# Patient Record
Sex: Male | Born: 1965 | Race: White | Hispanic: No | Marital: Single | Smoking: Never smoker
Health system: Southern US, Community
[De-identification: ages and names within clinical notes are randomized; demographics above are authoritative.]

## PROBLEM LIST (undated history)

## (undated) DIAGNOSIS — I82409 Acute embolism and thrombosis of unspecified deep veins of unspecified lower extremity: Secondary | ICD-10-CM

## (undated) DIAGNOSIS — I10 Essential (primary) hypertension: Secondary | ICD-10-CM

## (undated) HISTORY — DX: Acute embolism and thrombosis of unspecified deep veins of unspecified lower extremity: I82.409

---

## 2006-07-13 ENCOUNTER — Other Ambulatory Visit: Payer: Self-pay

## 2006-07-13 ENCOUNTER — Inpatient Hospital Stay: Payer: Self-pay | Admitting: Internal Medicine

## 2015-05-21 ENCOUNTER — Encounter: Payer: Self-pay | Admitting: Emergency Medicine

## 2015-05-21 ENCOUNTER — Inpatient Hospital Stay
Admission: EM | Admit: 2015-05-21 | Discharge: 2015-05-23 | DRG: 176 | Disposition: A | Discharge status: Other Acute Inpatient Hospital | Payer: BLUE CROSS/BLUE SHIELD | Attending: Internal Medicine | Admitting: Internal Medicine

## 2015-05-21 ENCOUNTER — Emergency Department: Payer: BLUE CROSS/BLUE SHIELD

## 2015-05-21 DIAGNOSIS — I10 Essential (primary) hypertension: Secondary | ICD-10-CM | POA: Diagnosis present

## 2015-05-21 DIAGNOSIS — K219 Gastro-esophageal reflux disease without esophagitis: Secondary | ICD-10-CM | POA: Diagnosis present

## 2015-05-21 DIAGNOSIS — I82431 Acute embolism and thrombosis of right popliteal vein: Secondary | ICD-10-CM | POA: Diagnosis present

## 2015-05-21 DIAGNOSIS — I5032 Chronic diastolic (congestive) heart failure: Secondary | ICD-10-CM | POA: Diagnosis present

## 2015-05-21 DIAGNOSIS — Z8249 Family history of ischemic heart disease and other diseases of the circulatory system: Secondary | ICD-10-CM | POA: Diagnosis not present

## 2015-05-21 DIAGNOSIS — R0600 Dyspnea, unspecified: Secondary | ICD-10-CM | POA: Diagnosis not present

## 2015-05-21 DIAGNOSIS — I2602 Saddle embolus of pulmonary artery with acute cor pulmonale: Secondary | ICD-10-CM | POA: Diagnosis not present

## 2015-05-21 DIAGNOSIS — Z7982 Long term (current) use of aspirin: Secondary | ICD-10-CM

## 2015-05-21 DIAGNOSIS — R0902 Hypoxemia: Secondary | ICD-10-CM | POA: Insufficient documentation

## 2015-05-21 DIAGNOSIS — I2609 Other pulmonary embolism with acute cor pulmonale: Secondary | ICD-10-CM | POA: Diagnosis not present

## 2015-05-21 DIAGNOSIS — Z79899 Other long term (current) drug therapy: Secondary | ICD-10-CM | POA: Diagnosis not present

## 2015-05-21 DIAGNOSIS — I959 Hypotension, unspecified: Secondary | ICD-10-CM | POA: Insufficient documentation

## 2015-05-21 DIAGNOSIS — I2699 Other pulmonary embolism without acute cor pulmonale: Secondary | ICD-10-CM | POA: Diagnosis present

## 2015-05-21 DIAGNOSIS — R0603 Acute respiratory distress: Secondary | ICD-10-CM | POA: Insufficient documentation

## 2015-05-21 DIAGNOSIS — I272 Other secondary pulmonary hypertension: Secondary | ICD-10-CM | POA: Diagnosis present

## 2015-05-21 DIAGNOSIS — I82411 Acute embolism and thrombosis of right femoral vein: Secondary | ICD-10-CM | POA: Diagnosis present

## 2015-05-21 DIAGNOSIS — I517 Cardiomegaly: Secondary | ICD-10-CM | POA: Insufficient documentation

## 2015-05-21 DIAGNOSIS — I824Z9 Acute embolism and thrombosis of unspecified deep veins of unspecified distal lower extremity: Secondary | ICD-10-CM | POA: Diagnosis not present

## 2015-05-21 DIAGNOSIS — I499 Cardiac arrhythmia, unspecified: Secondary | ICD-10-CM | POA: Diagnosis present

## 2015-05-21 DIAGNOSIS — R609 Edema, unspecified: Secondary | ICD-10-CM | POA: Insufficient documentation

## 2015-05-21 DIAGNOSIS — I2692 Saddle embolus of pulmonary artery without acute cor pulmonale: Secondary | ICD-10-CM | POA: Diagnosis not present

## 2015-05-21 DIAGNOSIS — D696 Thrombocytopenia, unspecified: Secondary | ICD-10-CM | POA: Diagnosis not present

## 2015-05-21 HISTORY — DX: Essential (primary) hypertension: I10

## 2015-05-21 LAB — BASIC METABOLIC PANEL
Anion gap: 10 (ref 5–15)
BUN: 10 mg/dL (ref 6–20)
CO2: 24 mmol/L (ref 22–32)
Calcium: 9.1 mg/dL (ref 8.9–10.3)
Chloride: 104 mmol/L (ref 101–111)
Creatinine, Ser: 1.08 mg/dL (ref 0.61–1.24)
GFR calc Af Amer: >60 mL/min (ref >60)
GFR calc non Af Amer: >60 mL/min (ref >60)
Glucose, Bld: 144 mg/dL — ABNORMAL HIGH (ref 65–99)
Potassium: 3.7 mmol/L (ref 3.5–5.1)
Sodium: 138 mmol/L (ref 135–145)

## 2015-05-21 LAB — CBC WITH DIFFERENTIAL/PLATELET
BASOS ABS: 0 10*3/uL (ref 0–0.1)
Basophils Relative: 0 %
Eosinophils Absolute: 0.2 10*3/uL (ref 0–0.7)
Eosinophils Relative: 1 %
HEMATOCRIT: 55.5 % — AB (ref 40.0–52.0)
Hemoglobin: 18.5 g/dL — ABNORMAL HIGH (ref 13.0–18.0)
LYMPHS ABS: 2.7 10*3/uL (ref 1.0–3.6)
LYMPHS PCT: 20 %
MCH: 31.5 pg (ref 26.0–34.0)
MCHC: 33.4 g/dL (ref 32.0–36.0)
MCV: 94.3 fL (ref 80.0–100.0)
MONO ABS: 1.3 10*3/uL — AB (ref 0.2–1.0)
MONOS PCT: 10 %
NEUTROS ABS: 9.2 10*3/uL — AB (ref 1.4–6.5)
Neutrophils Relative %: 69 %
Platelets: 139 10*3/uL — ABNORMAL LOW (ref 150–440)
RBC: 5.88 MIL/uL (ref 4.40–5.90)
RDW: 14.4 % (ref 11.5–14.5)
WBC: 13.4 10*3/uL — ABNORMAL HIGH (ref 3.8–10.6)

## 2015-05-21 LAB — APTT: aPTT: 26 seconds (ref 24–36)

## 2015-05-21 LAB — FIBRIN DERIVATIVES D-DIMER (ARMC ONLY): FIBRIN DERIVATIVES D-DIMER (ARMC): 5116 — AB (ref 0–499)

## 2015-05-21 LAB — PROTIME-INR
INR: 1.21
Prothrombin Time: 15.5 seconds — ABNORMAL HIGH (ref 11.4–15.0)

## 2015-05-21 LAB — TROPONIN I
TROPONIN I: 0.06 ng/mL — AB (ref <0.031)
Troponin I: 0.05 ng/mL — ABNORMAL HIGH (ref <0.031)

## 2015-05-21 MED ORDER — ASPIRIN 81 MG PO CHEW
324.0000 mg | CHEWABLE_TABLET | Freq: Once | ORAL | Status: AC
  Administered 2015-05-21: 324 mg via ORAL
  Filled 2015-05-21: qty 4

## 2015-05-21 MED ORDER — SODIUM CHLORIDE 0.9 % IJ SOLN
3.0000 mL | Freq: Two times a day (BID) | INTRAMUSCULAR | Status: DC
  Administered 2015-05-22 – 2015-05-23 (×4): 3 mL via INTRAVENOUS

## 2015-05-21 MED ORDER — SODIUM CHLORIDE 0.9 % IV BOLUS (SEPSIS)
1000.0000 mL | Freq: Once | INTRAVENOUS | Status: AC
  Administered 2015-05-21: 1000 mL via INTRAVENOUS

## 2015-05-21 MED ORDER — VERAPAMIL HCL ER 240 MG PO TBCR
240.0000 mg | EXTENDED_RELEASE_TABLET | Freq: Every day | ORAL | Status: DC
  Administered 2015-05-22 – 2015-05-23 (×2): 240 mg via ORAL
  Filled 2015-05-21 (×2): qty 1

## 2015-05-21 MED ORDER — PANTOPRAZOLE SODIUM 40 MG PO TBEC
40.0000 mg | DELAYED_RELEASE_TABLET | Freq: Every day | ORAL | Status: DC
  Administered 2015-05-22 – 2015-05-23 (×2): 40 mg via ORAL
  Filled 2015-05-21 (×2): qty 1

## 2015-05-21 MED ORDER — RAMIPRIL 10 MG PO CAPS
10.0000 mg | ORAL_CAPSULE | Freq: Every day | ORAL | Status: DC
  Administered 2015-05-22 – 2015-05-23 (×2): 10 mg via ORAL
  Filled 2015-05-21 (×2): qty 1

## 2015-05-21 MED ORDER — FUROSEMIDE 40 MG PO TABS
40.0000 mg | ORAL_TABLET | Freq: Every day | ORAL | Status: DC
  Administered 2015-05-22 – 2015-05-23 (×2): 40 mg via ORAL
  Filled 2015-05-21 (×2): qty 1

## 2015-05-21 MED ORDER — ADULT MULTIVITAMIN W/MINERALS CH
1.0000 | ORAL_TABLET | Freq: Every day | ORAL | Status: DC
  Administered 2015-05-22 – 2015-05-23 (×2): 1 via ORAL
  Filled 2015-05-21 (×2): qty 1

## 2015-05-21 MED ORDER — IOHEXOL 350 MG/ML SOLN
100.0000 mL | Freq: Once | INTRAVENOUS | Status: AC | PRN
  Administered 2015-05-21: 100 mL via INTRAVENOUS

## 2015-05-21 MED ORDER — ASPIRIN EC 81 MG PO TBEC
81.0000 mg | DELAYED_RELEASE_TABLET | Freq: Every day | ORAL | Status: DC
  Administered 2015-05-22 – 2015-05-23 (×2): 81 mg via ORAL
  Filled 2015-05-21 (×2): qty 1

## 2015-05-21 MED ORDER — HEPARIN SODIUM (PORCINE) 5000 UNIT/ML IJ SOLN: 4000.0000 [IU] | Freq: Once | INTRAMUSCULAR | Status: DC

## 2015-05-21 MED ORDER — IPRATROPIUM-ALBUTEROL 0.5-2.5 (3) MG/3ML IN SOLN: 3.0000 mL | RESPIRATORY_TRACT | Status: DC | PRN

## 2015-05-21 MED ORDER — HEPARIN (PORCINE) IN NACL 100-0.45 UNIT/ML-% IJ SOLN
1700.0000 [IU]/h | INTRAMUSCULAR | Status: DC
  Administered 2015-05-21: 1500 [IU]/h via INTRAVENOUS
  Administered 2015-05-22: 1700 [IU]/h via INTRAVENOUS
  Filled 2015-05-21 (×3): qty 250

## 2015-05-21 MED ORDER — HEPARIN BOLUS VIA INFUSION
5400.0000 [IU] | Freq: Once | INTRAVENOUS | Status: AC
  Administered 2015-05-21: 5400 [IU] via INTRAVENOUS
  Filled 2015-05-21: qty 5400

## 2015-05-21 MED ORDER — GABAPENTIN 300 MG PO CAPS
300.0000 mg | ORAL_CAPSULE | Freq: Every day | ORAL | Status: DC
  Filled 2015-05-21: qty 1

## 2015-05-21 MED ORDER — DICLOFENAC SODIUM 25 MG PO TBEC
75.0000 mg | DELAYED_RELEASE_TABLET | Freq: Two times a day (BID) | ORAL | Status: DC
  Administered 2015-05-22: 75 mg via ORAL
  Filled 2015-05-21 (×3): qty 1

## 2015-05-21 NOTE — Progress Notes (Addendum)
ANTICOAGULATION CONSULT NOTE - Initial Consult  Pharmacy Consult for Heparin   Indication: pulmonary embolus  Allergies  Allergen Reactions  . Penicillins Hives    Patient Measurements: Height: 5\' 8"  (172.7 cm) Weight: 225 lb (102.059 kg) IBW/kg (Calculated) : 68.4 Heparin Dosing Weight: 90.5 kg   Vital Signs: Temp: 98.4 F (36.9 C) (12/29 1704) Temp Source: Oral (12/29 1704) BP: 136/111 mmHg (12/29 1930) Pulse Rate: 122 (12/29 1930)  Labs:  Recent Labs  05/21/15 1659 05/21/15 2007  HGB 18.5*  --   HCT 55.5*  --   PLT 139*  --   CREATININE 1.08  --   TROPONINI 0.05* 0.06*    Estimated Creatinine Clearance: 93.7 mL/min (by C-G formula based on Cr of 1.08).   Medical History: Past Medical History  Diagnosis Date  . CHF (congestive heart failure) (HCC)   . Hypertension     Medications:   (Not in a hospital admission)  Assessment: Pharmacy consulted to dose heparin in this 49 year old male admitted with P.E.  No prior anticoag noted.   Goal of Therapy:  Heparin level 0.3-0.7 units/ml Monitor platelets by anticoagulation protocol: Yes   Plan:  Give 5400 units bolus x 1 Start heparin infusion at 1500 units/hr  Will order baseline INR and aPTT. Will draw 1st HL 6 hrs after start of drip on 12/30 @ 0400  Robbins,Jason D 05/21/2015,9:45 PM

## 2015-05-21 NOTE — ED Provider Notes (Signed)
[facility] Emergency Department Provider Note    ____________________________________________  Time seen: 1700  I have reviewed the triage vital signs and the nursing notes.   HISTORY  Chief Complaint Shortness of breath  History limited by: Not Limited   HPI Jermaine Blake is a 49 y.o. male who presents to the emergency department today from primary care doctor's office. The patient states he went to visit his primary care doctor because of shortness breath. He states it is been going on for 3 days. It has been constant. He has had a cough that is intermittently productive. He denies any chest pain. Denies any fevers. At the doctor's office and EKG was obtained which showed ventricular bigeminy. He was sent over for this ekg.   Past Medical History  Diagnosis Date  . CHF (congestive heart failure) (HCC)   . Hypertension     Patient Active Problem List   Diagnosis Date Noted  . Pulmonary embolism (HCC) 05/21/2015    History reviewed. No pertinent past surgical history.  Current Outpatient Rx  Name  Route  Sig  Dispense  Refill  . aspirin EC 81 MG tablet   Oral   Take 1 tablet by mouth daily.         . diclofenac (VOLTAREN) 75 MG EC tablet   Oral   Take 1 tablet by mouth 2 (two) times daily.         . furosemide (LASIX) 40 MG tablet   Oral   Take 1 tablet by mouth daily.         Marland Kitchen. gabapentin (NEURONTIN) 300 MG capsule   Oral   Take 1 capsule by mouth daily.         Marland Kitchen. ibuprofen (ADVIL,MOTRIN) 200 MG tablet   Oral   Take 1 tablet by mouth every 6 (six) hours as needed.         . Multiple Vitamin (MULTIVITAMIN WITH MINERALS) TABS tablet   Oral   Take 1 tablet by mouth daily.         . pantoprazole (PROTONIX) 40 MG tablet   Oral   Take 1 tablet by mouth daily.         . ramipril (ALTACE) 10 MG capsule   Oral   Take 1 capsule by mouth daily.         . verapamil (VERELAN PM) 240 MG 24 hr capsule   Oral   Take 1  capsule by mouth daily.           Allergies Penicillins  Family History  Problem Relation Age of Onset  . Hypertension Other     Social History Social History  Substance Use Topics  . Smoking status: Never Smoker   . Smokeless tobacco: None  . Alcohol Use: Yes     Comment: socially    Review of Systems  Constitutional: Negative for fever. Cardiovascular: Negative for chest pain. Respiratory: Positive for shortness of breath. Gastrointestinal: Negative for abdominal pain, vomiting and diarrhea. Neurological: Negative for headaches, focal weakness or numbness.  10-point ROS otherwise negative.  ____________________________________________   PHYSICAL EXAM:  VITAL SIGNS:   98.4 F (36.9 C)   128   22   162/101 mmHg  93 %      Constitutional: Alert and oriented. Well appearing and in no distress. Eyes: Conjunctivae are normal. PERRL. Normal extraocular movements. ENT   Head: Normocephalic and atraumatic.   Nose: No congestion/rhinnorhea.   Mouth/Throat: Mucous membranes are moist.  Neck: No stridor. Hematological/Lymphatic/Immunilogical: No cervical lymphadenopathy. Cardiovascular: Tachycardic, regular rhythm.  No murmurs, rubs, or gallops. Respiratory: Mildly increased respiratory effort. Tachypnea. Gastrointestinal: Soft and nontender. No distention.  Genitourinary: Deferred Musculoskeletal: Normal range of motion in all extremities. No joint effusions.  No lower extremity tenderness nor edema. Neurologic:  Normal speech and language. No gross focal neurologic deficits are appreciated.  Skin:  Skin is warm, dry and intact. No rash noted. Psychiatric: Mood and affect are normal. Speech and behavior are normal. Patient exhibits appropriate insight and judgment.  ____________________________________________    LABS (pertinent positives/negatives)  Labs Reviewed  CBC WITH DIFFERENTIAL/PLATELET - Abnormal; Notable for the following:    WBC  13.4 (*)    Hemoglobin 18.5 (*)    HCT 55.5 (*)    Platelets 139 (*)    Neutro Abs 9.2 (*)    Monocytes Absolute 1.3 (*)    All other components within normal limits  BASIC METABOLIC PANEL - Abnormal; Notable for the following:    Glucose, Bld 144 (*)    All other components within normal limits  TROPONIN I - Abnormal; Notable for the following:    Troponin I 0.05 (*)    All other components within normal limits  FIBRIN DERIVATIVES D-DIMER ([facility] ONLY) - Abnormal; Notable for the following:    Fibrin derivatives D-dimer [facility]) 5116 (*)    All other components within normal limits  TROPONIN I - Abnormal; Notable for the following:    Troponin I 0.06 (*)    All other components within normal limits  PROTIME-INR - Abnormal; Notable for the following:    Prothrombin Time 15.5 (*)    All other components within normal limits  APTT  BASIC METABOLIC PANEL  CBC  HEPARIN LEVEL (UNFRACTIONATED)     ____________________________________________   EKG  I, Phineas Semen, attending physician, personally viewed and interpreted this EKG  EKG Time: 1657 Rate: 137 Rhythm: sinus tachycardia Axis: normal Intervals: qtc 592 QRS: wide, RBBB ST changes: no st elevation Impression: abnormal ekg  EKG from PCPs office timed 1631 consistent with ventricular bigeminy   ____________________________________________    RADIOLOGY  CXR IMPRESSION: No active cardiopulmonary disease.  CTAPE  IMPRESSION: 1. Positive for acute PE with CTevidence of right heart strain (RV/LV Ratio = 1.9) consistent with at least submassive (intermediate risk) PE. The presence of right heart strain has been associated with an increased risk of morbidity and mortality. 2. Question mild gallbladder wall thickening with possible pericholecystic edema. Abdominal ultrasound could be used to further evaluate as clinically warranted. 3. Mild hepatoduodenal ligament lymphadenopathy.  I, Phineas Semen,  personally discussed these images and results by phone with the on-call radiologist and used this discussion as part of my medical decision making.    ___________________________________________   PROCEDURES  Procedure(s) performed: None  Critical Care performed: Yes, see critical care note(s)  CRITICAL CARE Performed by: Phineas Semen   Total critical care time: 40 minutes  Critical care time was exclusive of separately billable procedures and treating other patients.  Critical care was necessary to treat or prevent imminent or life-threatening deterioration.  Critical care was time spent personally by me on the following activities: development of treatment plan with patient and/or surrogate as well as nursing, discussions with consultants, evaluation of patient's response to treatment, examination of patient, obtaining history from patient or surrogate, ordering and performing treatments and interventions, ordering and review of laboratory studies, ordering and review of radiographic studies, pulse oximetry and re-evaluation of patient's  condition.  ____________________________________________   INITIAL IMPRESSION / ASSESSMENT AND PLAN / ED COURSE  Pertinent labs & imaging results that were available during my care of the patient were reviewed by me and considered in my medical decision making (see chart for details).  Patient presented to the emergency department today because of concerns for shortness breath and cough and abnormal EKG a primary care doctor's office. Patient was tachycardic upon initial arrival. Patient also had an elevated troponin. D-dimer was significantly elevated so a CTA PE was obtained. This did show bilateral significant clot burden. I had a discussion with critical care [facility] about potential thrombolytic therapy. Given that the patient is not hypoxic nor hypotensive they did not feel he was a candidate for thrombolytic therapy at this time.  Patient will be admitted to the hospital service and placed on heparin.  ____________________________________________   FINAL CLINICAL IMPRESSION(S) / ED DIAGNOSES  Final diagnoses:  Other acute pulmonary embolism (HCC)     Phineas Semen, MD 05/21/15 2313

## 2015-05-21 NOTE — Progress Notes (Signed)
eLink Physician-Brief Progress Note Patient Name: Venetia NightJerry L Gonterman DOB: 02-15-1966 MRN: [id number]   Date of Service  05/21/2015  HPI/Events of Note  49 yo male with unknown PMH. Presents to PCP with 3 day Hx of SOB. Sent to ED >> Chest CTA >> submassive bilateral PE with evidence of R heart strain. Meets criteria for PCCM consultation d/t persistent tachycardia (115-130) and positive troponin (0.05 >> 0.06). However, he is hemodynamically stable (SBP = 130-140) and his O2 sat on 2.0 L/min [place] O2 is in the mid to high 90's.   eICU Interventions  Plan: 1. Admit to stepdown unit or ICU (stepdown overflow) on the Hospitalist Service. 2. Heparin IV infusion. 3. 2D Cardiac Echo. 4. PCCM consultation. Dr. Belia HemanKasa is already aware. 5. Re-evaluate for [facility] for [facility] if he develops hemodynamic instability, refractory hypoxia or severe R heart dysfunction on 2D Cardiac Echo.   I have discussed the case with Dr. Belia HemanKasa (PCCM on call physician) who agrees with the above therapeutic plan.     Intervention Category Evaluation Type: New Patient Evaluation  Lenell AntuSommer,Dalin Caldera [place] 05/21/2015, 9:44 PM

## 2015-05-21 NOTE — H&P (Signed)
[facility] - [place] [facility]   PATIENT NAME: Jermaine Blake    MR#:  [id number]  DATE OF BIRTH:  [birth year]   DATE OF ADMISSION:  05/21/2015  PRIMARY CARE PHYSICIAN: Danella Penton., MD   REQUESTING/REFERRING PHYSICIAN: Derrill Kay  CHIEF COMPLAINT:   Chief Complaint  Patient presents with  . Cough  . Shortness of Breath  . Irregular Heart Beat    HISTORY OF PRESENT ILLNESS:  Jermaine Blake  is a 49 y.o. male with a known history of essential hypertension as well as congestive heart failure unspecified ejection fraction who is presenting with shortness of breath and cough. He states a few day duration of shortness of breath originally with dyspnea on exertion now at rest as well. Associated cough nonproductive however did have an episode of hemoptysis. He also describes having an episode of pleuritic chest pain left lower chest and mid axillary line which has resolved. He is presenting to the hospital after seeing his PCP who then recommended him to come to the Hospital further workup and evaluation.  PAST MEDICAL HISTORY:   Past Medical History  Diagnosis Date  . CHF (congestive heart failure) (HCC)   . Hypertension     PAST SURGICAL HISTORY:  History reviewed. No pertinent past surgical history.  SOCIAL HISTORY:   Social History  Substance Use Topics  . Smoking status: Never Smoker   . Smokeless tobacco: Not on file  . Alcohol Use: Yes     Comment: socially    FAMILY HISTORY:   Family History  Problem Relation Age of Onset  . Hypertension Other     DRUG ALLERGIES:   Allergies  Allergen Reactions  . Penicillins Hives    REVIEW OF SYSTEMS:  REVIEW OF SYSTEMS:  CONSTITUTIONAL: Denies fevers, chills, fatigue, weakness.  EYES: Denies blurred vision, double vision, or eye pain.  EARS, NOSE, THROAT: Denies tinnitus, ear pain, hearing loss.  RESPIRATORY: Positive cough, shortness of breath, tinnitus wheezing  CARDIOVASCULAR:  Positive chest pain, denies palpitations, edema.  GASTROINTESTINAL: Denies nausea, vomiting, diarrhea, abdominal pain.  GENITOURINARY: Denies dysuria, hematuria.  ENDOCRINE: Denies nocturia or thyroid problems. HEMATOLOGIC AND LYMPHATIC: Denies easy bruising or bleeding.  SKIN: Denies rash or lesions.  MUSCULOSKELETAL: Denies pain in neck, back, shoulder, knees, hips, or further arthritic symptoms.  NEUROLOGIC: Denies paralysis, paresthesias.  PSYCHIATRIC: Denies anxiety or depressive symptoms. Otherwise full review of systems performed by me is negative.   MEDICATIONS AT HOME:   Prior to Admission medications   Medication Sig Start Date End Date Taking? Authorizing Provider  aspirin EC 81 MG tablet Take 1 tablet by mouth daily.   Yes Historical Provider, MD  diclofenac (VOLTAREN) 75 MG EC tablet Take 1 tablet by mouth 2 (two) times daily.   Yes Historical Provider, MD  furosemide (LASIX) 40 MG tablet Take 1 tablet by mouth daily. 07/31/14  Yes Historical Provider, MD  gabapentin (NEURONTIN) 300 MG capsule Take 1 capsule by mouth daily.   Yes Historical Provider, MD  ibuprofen (ADVIL,MOTRIN) 200 MG tablet Take 1 tablet by mouth every 6 (six) hours as needed.   Yes Historical Provider, MD  Multiple Vitamin (MULTIVITAMIN WITH MINERALS) TABS tablet Take 1 tablet by mouth daily.   Yes Historical Provider, MD  pantoprazole (PROTONIX) 40 MG tablet Take 1 tablet by mouth daily. 07/31/14  Yes Historical Provider, MD  ramipril (ALTACE) 10 MG capsule Take 1 capsule by mouth daily. 07/31/14  Yes Historical Provider, MD  verapamil (VERELAN PM)  240 MG 24 hr capsule Take 1 capsule by mouth daily. 07/31/14  Yes Historical Provider, MD      VITAL SIGNS:  Blood pressure 136/111, pulse 122, temperature 98.4 F (36.9 C), temperature source Oral, resp. rate 21, height 5\' 8"  (1.727 m), weight 225 lb (102.059 kg), SpO2 94 %.  PHYSICAL EXAMINATION:  VITAL SIGNS: Filed Vitals:   05/21/15 1900 05/21/15 1930   BP: 143/105 136/111  Pulse: 119 122  Temp:    Resp: 24 21   GENERAL:49 y.o.male currently in no acute distress.  HEAD: Normocephalic, atraumatic.  EYES: Pupils equal, round, reactive to light. Extraocular muscles intact. No scleral icterus.  MOUTH: Moist mucosal membrane. Dentition intact. No abscess noted.  EAR, NOSE, THROAT: Clear without exudates. No external lesions.  NECK: Supple. No thyromegaly. No nodules. No JVD.  PULMONARY: Clear to ascultation, without wheeze rails or rhonci. No use of accessory muscles, Good respiratory effort. good air entry bilaterally CHEST: Nontender to palpation.  CARDIOVASCULAR: S1 and S2. Tachycardic. No murmurs, rubs, or gallops. No edema. Pedal pulses 2+ bilaterally.  GASTROINTESTINAL: Soft, nontender, nondistended. No masses. Positive bowel sounds. No hepatosplenomegaly.  MUSCULOSKELETAL: No swelling, clubbing, or edema. Range of motion full in all extremities.  NEUROLOGIC: Cranial nerves II through XII are intact. No gross focal neurological deficits. Sensation intact. Reflexes intact.  SKIN: No ulceration, lesions, rashes, or cyanosis. Skin warm and dry. Turgor intact.  PSYCHIATRIC: Mood, affect within normal limits. The patient is awake, alert and oriented x 3. Insight, judgment intact.    LABORATORY PANEL:   CBC  Recent Labs Lab 05/21/15 1659  WBC 13.4*  HGB 18.5*  HCT 55.5*  PLT 139*   ------------------------------------------------------------------------------------------------------------------  Chemistries   Recent Labs Lab 05/21/15 1659  NA 138  K 3.7  CL 104  CO2 24  GLUCOSE 144*  BUN 10  CREATININE 1.08  CALCIUM 9.1   ------------------------------------------------------------------------------------------------------------------  Cardiac Enzymes  Recent Labs Lab 05/21/15 2007  TROPONINI 0.06*    ------------------------------------------------------------------------------------------------------------------  RADIOLOGY:  Dg Chest 2 View  05/21/2015  CLINICAL DATA:  Shortness of breath EXAM: CHEST  2 VIEW COMPARISON:  07/13/2006 chest radiograph. FINDINGS: Stable cardiomediastinal silhouette with top-normal heart size. No pneumothorax. No pleural effusion. Clear lungs, with no focal lung consolidation and no pulmonary edema. IMPRESSION: No active cardiopulmonary disease. Electronically Signed   By: Delbert PhenixJason A Poff M.D.   On: 05/21/2015 17:42   Ct Angio Chest Pe W/cm &/or Wo Cm  05/21/2015  CLINICAL DATA:  Initial encounter for cough and difficulty breathing starting 3 days ago but worsening today. EXAM: CT ANGIOGRAPHY CHEST WITH CONTRAST TECHNIQUE: Multidetector CT imaging of the chest was performed using the standard protocol during bolus administration of intravenous contrast. Multiplanar CT image reconstructions and MIPs were obtained to evaluate the vascular anatomy. CONTRAST:  100mL OMNIPAQUE IOHEXOL 350 MG/ML SOLN COMPARISON:  None. FINDINGS: Mediastinum / Lymph Nodes: There is no axillary lymphadenopathy. No mediastinal lymphadenopathy. There is no hilar lymphadenopathy. Heart size is normal. No pericardial effusion. The esophagus has normal imaging features. No thoracic aortic aneurysm. No dissection of the thoracic aorta. Large volume pulmonary embolus is evident in both main pulmonary arteries, extending into lobar and segmental pulmonary arteries of both upper and lower lobes. Thrombus is occlusive in some segmental branches bilaterally. There is paradoxical inversion of the interventricular septum and the RV / LV ratio is 1.9. Lungs / Pleura: Lung windows show no focal airspace consolidation. There is some atelectasis in the lower lungs bilaterally, left greater  than right. No definite pulmonary infarct at this time. No pulmonary edema or pleural effusion. Upper Abdomen: Imaging through  the upper abdomen suggests mild gallbladder wall thickening with potentially a small amount of pericholecystic fluid. Mild lymphadenopathy is associated in the hepatoduodenal ligament. MSK / Soft Tissues: Bone windows reveal no worrisome lytic or sclerotic osseous lesions. Review of the MIP images confirms the above findings. IMPRESSION: 1. Positive for acute PE with CTevidence of right heart strain (RV/LV Ratio = 1.9) consistent with at least submassive (intermediate risk) PE. The presence of right heart strain has been associated with an increased risk of morbidity and mortality. 2. Question mild gallbladder wall thickening with possible pericholecystic edema. Abdominal ultrasound could be used to further evaluate as clinically warranted. 3. Mild hepatoduodenal ligament lymphadenopathy. Critical Value/emergent results were called by me at the time of interpretation on 05/21/2015 at 9:12 pm to Dr. Phineas Semen , who verbally acknowledged these results. Electronically Signed   By: Kennith Center M.D.   On: 05/21/2015 21:12    EKG:   Orders placed or performed during the hospital encounter of 05/21/15  . EKG 12-Lead  . EKG 12-Lead  . EKG 12-Lead  . EKG 12-Lead    IMPRESSION AND PLAN:   49 year old Caucasian gentleman history of essential hypertension who is presenting with shortness of breath, found to have pulmonary embolism  1 acute pulmonary embolus: There was evidence of right heart strain suggested by CT findings emergency department staff discussed case with critical care as well as pulmonology for further recommendations. At this time he does not qualify for any advanced interventional procedure or TPA given he is hemodynamically stable. He has been placed on heparin drip, continue anticoagulant coagulation place on telemetry trend cardiac enzymes, check echocardiogram.- 2. Essential hypertension: Continue home medications 3. GERD without esophagitis PPI therapy 4. Venous thromboembolism  prophylactic: Therapeutic heparin   All the records are reviewed and case discussed with ED provider. Management plans discussed with the patient, family and they are in agreement.  CODE STATUS: Full  TOTAL TIME TAKING CARE OF THIS PATIENT: 35 minutes.    Hower,  Mardi Mainland.D on 05/21/2015 at 10:53 PM  Between 7am to 6pm - Pager - [phone]  After 6pm: House Pager: - [phone]  [facility]  Office  [phone]  CC: Primary care physician; Danella Penton., MD

## 2015-05-21 NOTE — ED Notes (Signed)
Pt to ED from home c/o cough and difficulty breathing starting 3 days ago and worsening to today.  Pt states intermittent productive cough, otherwise denies chest pain, denies dizziness, or diaphoresis.  Pt states hx of CHF.  Pt reports regularly taking medications at home to control heart rate.  Pt is A&Ox4, speaking in complete and coherent sentences.

## 2015-05-22 ENCOUNTER — Inpatient Hospital Stay
Admit: 2015-05-22 | Discharge: 2015-05-22 | Disposition: A | Discharge status: Home / Self Care | Payer: BLUE CROSS/BLUE SHIELD | Attending: Internal Medicine | Admitting: Internal Medicine

## 2015-05-22 ENCOUNTER — Inpatient Hospital Stay: Payer: BLUE CROSS/BLUE SHIELD

## 2015-05-22 DIAGNOSIS — I2692 Saddle embolus of pulmonary artery without acute cor pulmonale: Secondary | ICD-10-CM

## 2015-05-22 LAB — BASIC METABOLIC PANEL
Anion gap: 9 (ref 5–15)
BUN: 10 mg/dL (ref 6–20)
CO2: 22 mmol/L (ref 22–32)
CREATININE: 1.12 mg/dL (ref 0.61–1.24)
Calcium: 8.7 mg/dL — ABNORMAL LOW (ref 8.9–10.3)
Chloride: 108 mmol/L (ref 101–111)
Glucose, Bld: 116 mg/dL — ABNORMAL HIGH (ref 65–99)
POTASSIUM: 4.1 mmol/L (ref 3.5–5.1)
SODIUM: 139 mmol/L (ref 135–145)

## 2015-05-22 LAB — GLUCOSE, CAPILLARY: GLUCOSE-CAPILLARY: 129 mg/dL — AB (ref 65–99)

## 2015-05-22 LAB — CBC
HCT: 46.2 % (ref 40.0–52.0)
HEMOGLOBIN: 15.5 g/dL (ref 13.0–18.0)
MCH: 32 pg (ref 26.0–34.0)
MCHC: 33.6 g/dL (ref 32.0–36.0)
MCV: 95.2 fL (ref 80.0–100.0)
Platelets: 99 10*3/uL — ABNORMAL LOW (ref 150–440)
RBC: 4.86 MIL/uL (ref 4.40–5.90)
RDW: 14.5 % (ref 11.5–14.5)
WBC: 10.2 10*3/uL (ref 3.8–10.6)

## 2015-05-22 LAB — HEPARIN LEVEL (UNFRACTIONATED): HEPARIN UNFRACTIONATED: 0.28 [IU]/mL — AB (ref 0.30–0.70)

## 2015-05-22 LAB — TROPONIN I
TROPONIN I: 0.14 ng/mL — AB (ref <0.031)
Troponin I: 0.05 ng/mL — ABNORMAL HIGH (ref <0.031)

## 2015-05-22 LAB — MAGNESIUM: Magnesium: 1.9 mg/dL (ref 1.7–2.4)

## 2015-05-22 LAB — MRSA PCR SCREENING: MRSA by PCR: NEGATIVE

## 2015-05-22 MED ORDER — HEPARIN BOLUS VIA INFUSION
1400.0000 [IU] | Freq: Once | INTRAVENOUS | Status: AC
  Administered 2015-05-22: 1400 [IU] via INTRAVENOUS
  Filled 2015-05-22: qty 1400

## 2015-05-22 MED ORDER — APIXABAN 5 MG PO TABS: 5.0000 mg | ORAL_TABLET | Freq: Two times a day (BID) | ORAL | Status: DC

## 2015-05-22 MED ORDER — APIXABAN 5 MG PO TABS
10.0000 mg | ORAL_TABLET | Freq: Two times a day (BID) | ORAL | Status: DC
  Administered 2015-05-22 – 2015-05-23 (×3): 10 mg via ORAL
  Filled 2015-05-22 (×3): qty 2

## 2015-05-22 MED ORDER — CETYLPYRIDINIUM CHLORIDE 0.05 % MT LIQD
7.0000 mL | Freq: Two times a day (BID) | OROMUCOSAL | Status: DC
  Administered 2015-05-22 – 2015-05-23 (×2): 7 mL via OROMUCOSAL

## 2015-05-22 NOTE — Care Management (Signed)
Patient admitted to icu with bilateral pulmonary embolus.  Discharge orders present and is to discharge home on Eliquis.  Provided patient with the 30 day free coupon and copay  assist coupon.  Patient states that he does have Nurse, learning disabilitycommercial insurance and does have coverage for medications.  During the conversation, note that patient appears dyspneic with conversation.  Per primary nurse, his  exertional  room air oxygen sat was 87.  His sustained heart rate has been low 100's but up to 130 with minimal exertion.  Patient will not discharge home today.   Will request script for 02 in the event there is a continued need for the 02.  Patient will need to be qualified for the home 02

## 2015-05-22 NOTE — Care Management (Signed)
It is documented that patient has a resting 02 sat of 79%. Patient's heart rate remains sustained at greater than 100. Primary nurse contacted CM in regards to  family concern that if patient discharge over the week end his drug store may not be not be ope and [facility]  Informed that would not be necessary- would just need to take the first script to a drug store that is open to use his coupon. Reported off to weekend care manager about concerns of low 02 sats and elevated heart rate in patient with new bilateral pulmonary embolus and sustained tachycardia

## 2015-05-22 NOTE — Progress Notes (Signed)
*  PRELIMINARY RESULTS* Echocardiogram 2D Echocardiogram has been performed.  Georgann HousekeeperJerry R Hege 05/22/2015, 2:17 PM

## 2015-05-22 NOTE — Consult Note (Signed)
PULMONARY CONSULT   PATIENT NAME: Jermaine Blake    MR#:  [id number]  DATE OF BIRTH:  05/25/1965   DATE OF ADMISSION:  05/21/2015  PRIMARY CARE PHYSICIAN: Danella Penton., MD   REQUESTING/REFERRING PHYSICIAN: HOWER  CHIEF COMPLAINT:   Chief Complaint  Patient presents with  . Cough  . Shortness of Breath  . Irregular Heart Beat    HISTORY OF PRESENT ILLNESS:  Jermaine Blake  is a 49 y.o. male with a known history of essential hypertension and remote h/o  CHF, patient is Surveyor, minerals, very active person, found to have b/l PE has been having symptoms of SOB and DOE for last 1 month. He Denies lower ext swelling.  Associated cough nonproductive however did have an episode of hemoptysis. He also describes having an episode of pleuritic chest pain left lower chest and mid axillary line which has resolved. He saw his  PCP who then recommended him to come to the Hospital further workup and evaluation.  Patient with NL BP, elevated HR and minimal oxygen requirement, oxygen helped his SOB  PAST MEDICAL HISTORY:   Past Medical History  Diagnosis Date  . CHF (congestive heart failure) (HCC)   . Hypertension     PAST SURGICAL HISTORY:  History reviewed. No pertinent past surgical history.  SOCIAL HISTORY:   Social History  Substance Use Topics  . Smoking status: Never Smoker   . Smokeless tobacco: Not on file  . Alcohol Use: Yes     Comment: socially    FAMILY HISTORY:   Family History  Problem Relation Age of Onset  . Hypertension Other     DRUG ALLERGIES:   Allergies  Allergen Reactions  . Penicillins Hives    REVIEW OF SYSTEMS:  REVIEW OF SYSTEMS:  CONSTITUTIONAL: Denies fevers, chills, fatigue, weakness.  EYES: Denies blurred vision, double vision, or eye pain.  EARS, NOSE, THROAT: Denies tinnitus, ear pain, hearing loss.  RESPIRATORY: Positive cough, shortness of breath, tinnitus wheezing  CARDIOVASCULAR: Positive chest pain, denies palpitations, edema.   GASTROINTESTINAL: Denies nausea, vomiting, diarrhea, abdominal pain.  GENITOURINARY: Denies dysuria, hematuria.  ENDOCRINE: Denies nocturia or thyroid problems. HEMATOLOGIC AND LYMPHATIC: Denies easy bruising or bleeding.  SKIN: Denies rash or lesions.  MUSCULOSKELETAL: Denies pain in neck, back, shoulder, knees, hips, or further arthritic symptoms.  NEUROLOGIC: Denies paralysis, paresthesias.  PSYCHIATRIC: Denies anxiety or depressive symptoms. Otherwise full review of systems is negative.   MEDICATIONS AT HOME:   Prior to Admission medications   Medication Sig Start Date End Date Taking? Authorizing Provider  aspirin EC 81 MG tablet Take 1 tablet by mouth daily.   Yes Historical Provider, MD  diclofenac (VOLTAREN) 75 MG EC tablet Take 1 tablet by mouth 2 (two) times daily.   Yes Historical Provider, MD  furosemide (LASIX) 40 MG tablet Take 1 tablet by mouth daily. 07/31/14  Yes Historical Provider, MD  gabapentin (NEURONTIN) 300 MG capsule Take 1 capsule by mouth daily.   Yes Historical Provider, MD  ibuprofen (ADVIL,MOTRIN) 200 MG tablet Take 1 tablet by mouth every 6 (six) hours as needed.   Yes Historical Provider, MD  Multiple Vitamin (MULTIVITAMIN WITH MINERALS) TABS tablet Take 1 tablet by mouth daily.   Yes Historical Provider, MD  pantoprazole (PROTONIX) 40 MG tablet Take 1 tablet by mouth daily. 07/31/14  Yes Historical Provider, MD  ramipril (ALTACE) 10 MG capsule Take 1 capsule by mouth daily. 07/31/14  Yes Historical Provider, MD  verapamil (VERELAN PM) 240 MG 24 hr capsule  Take 1 capsule by mouth daily. 07/31/14  Yes Historical Provider, MD      VITAL SIGNS:  Blood pressure 115/84, pulse 48, temperature 97.6 F (36.4 C), temperature source Oral, resp. rate 17, height 5\' 8"  (1.727 m), weight 228 lb 2.8 oz (103.5 kg), SpO2 91 %.  PHYSICAL EXAMINATION:  VITAL SIGNS: Filed Vitals:   05/22/15 0700 05/22/15 0733  BP: 115/84   Pulse: 48   Temp:  97.6 F (36.4 C)  Resp: 17     GENERAL:49 y.o.male currently in no acute distress.  HEAD: Normocephalic, atraumatic.  EYES: Pupils equal, round, reactive to light. Extraocular muscles intact. No scleral icterus.  MOUTH: Moist mucosal membrane. Dentition intact. No abscess noted.  EAR, NOSE, THROAT: Clear without exudates. No external lesions.  NECK: Supple. No thyromegaly. No nodules. No JVD.  PULMONARY: Clear to ascultation, without wheeze rails or rhonci. No use of accessory muscles, Good respiratory effort. good air entry bilaterally CHEST: Nontender to palpation.  CARDIOVASCULAR: S1 and S2. Tachycardic. No murmurs, rubs, or gallops. No edema. Pedal pulses 2+ bilaterally.  GASTROINTESTINAL: Soft, nontender, nondistended. No masses. Positive bowel sounds. No hepatosplenomegaly.  MUSCULOSKELETAL: No swelling, clubbing, or edema. Range of motion full in all extremities.  NEUROLOGIC: Cranial nerves II through XII are intact. No gross focal neurological deficits. Sensation intact. Reflexes intact.  SKIN: No ulceration, lesions, rashes, or cyanosis. Skin warm and dry. Turgor intact.  PSYCHIATRIC: Mood, affect within normal limits. The patient is awake, alert and oriented x 3.  LABORATORY PANEL:   CBC  Recent Labs Lab 05/22/15 0437  WBC 10.2  HGB 15.5  HCT 46.2  PLT 99*   ------------------------------------------------------------------------------------------------------------------  Chemistries   Recent Labs Lab 05/22/15 0002 05/22/15 0437  NA  --  139  K  --  4.1  CL  --  108  CO2  --  22  GLUCOSE  --  116*  BUN  --  10  CREATININE  --  1.12  CALCIUM  --  8.7*  MG 1.9  --    ------------------------------------------------------------------------------------------------------------------  Cardiac Enzymes  Recent Labs Lab 05/22/15 0437  TROPONINI 0.14*   ------------------------------------------------------------------------------------------------------------------  RADIOLOGY:  Dg  Chest 2 View  05/21/2015  CLINICAL DATA:  Shortness of breath EXAM: CHEST  2 VIEW COMPARISON:  07/13/2006 chest radiograph. FINDINGS: Stable cardiomediastinal silhouette with top-normal heart size. No pneumothorax. No pleural effusion. Clear lungs, with no focal lung consolidation and no pulmonary edema. IMPRESSION: No active cardiopulmonary disease. Electronically Signed   By: Delbert PhenixJason A Poff M.D.   On: 05/21/2015 17:42   Ct Angio Chest Pe W/cm &/or Wo Cm  05/21/2015  CLINICAL DATA:  Initial encounter for cough and difficulty breathing starting 3 days ago but worsening today. EXAM: CT ANGIOGRAPHY CHEST WITH CONTRAST TECHNIQUE: Multidetector CT imaging of the chest was performed using the standard protocol during bolus administration of intravenous contrast. Multiplanar CT image reconstructions and MIPs were obtained to evaluate the vascular anatomy. CONTRAST:  100mL OMNIPAQUE IOHEXOL 350 MG/ML SOLN COMPARISON:  None. FINDINGS: Mediastinum / Lymph Nodes: There is no axillary lymphadenopathy. No mediastinal lymphadenopathy. There is no hilar lymphadenopathy. Heart size is normal. No pericardial effusion. The esophagus has normal imaging features. No thoracic aortic aneurysm. No dissection of the thoracic aorta. Large volume pulmonary embolus is evident in both main pulmonary arteries, extending into lobar and segmental pulmonary arteries of both upper and lower lobes. Thrombus is occlusive in some segmental branches bilaterally. There is paradoxical inversion of the interventricular septum and the  RV / LV ratio is 1.9. Lungs / Pleura: Lung windows show no focal airspace consolidation. There is some atelectasis in the lower lungs bilaterally, left greater than right. No definite pulmonary infarct at this time. No pulmonary edema or pleural effusion. Upper Abdomen: Imaging through the upper abdomen suggests mild gallbladder wall thickening with potentially a small amount of pericholecystic fluid. Mild  lymphadenopathy is associated in the hepatoduodenal ligament. MSK / Soft Tissues: Bone windows reveal no worrisome lytic or sclerotic osseous lesions. Review of the MIP images confirms the above findings. IMPRESSION: 1. Positive for acute PE with CTevidence of right heart strain (RV/LV Ratio = 1.9) consistent with at least submassive (intermediate risk) PE. The presence of right heart strain has been associated with an increased risk of morbidity and mortality. 2. Question mild gallbladder wall thickening with possible pericholecystic edema. Abdominal ultrasound could be used to further evaluate as clinically warranted. 3. Mild hepatoduodenal ligament lymphadenopathy. Critical Value/emergent results were called by me at the time of interpretation on 05/21/2015 at 9:12 pm to Dr. Phineas Semen , who verbally acknowledged these results. Electronically Signed   By: Kennith Center M.D.   On: 05/21/2015 21:12    EKG:   Orders placed or performed during the hospital encounter of 05/21/15  . EKG 12-Lead  . EKG 12-Lead  . EKG 12-Lead  . EKG 12-Lead    IMPRESSION AND PLAN:   49 year old WM with acute B/L PE At this time, patient does NOT meet criteria for TPA.   1.continue anticoagulation therapy-consider transition to oral therapy 2.oxygen as needed 3.recommend Hem/ONC referral and can be done as outpatient 4.check [place] lower ext assess for DVT   No there recommendations. OK to transfer to gen med floor. Will sign off at this time I have personally obtained a history, examined the patient, evaluated Pertinent laboratory and RadioGraphic/imaging results, and  formulated the assessment and plan  The Patient requires high complexity decision making for assessment and support, frequent evaluation and titration of therapies. Patient satisfied with Plan of action and management. All questions answered  Lucie Leather, M.D.  [facility]  Medical Director [facility]  [facility] Medical Director [facility] Cardio-Pulmonary Department

## 2015-05-22 NOTE — Progress Notes (Signed)
Ambulated patient and he became short of breath, heart rate was up to 130's, oxygen saturation dropped to 87%, and case management notified. Case manager Nann stated that she would be talking with Dr. Amado CoeGouru. Dr. Belia HemanKasa also notified of ultrasound results.

## 2015-05-22 NOTE — Progress Notes (Signed)
ANTICOAGULATION CONSULT NOTE - Initial Consult  Pharmacy Consult for Eliquis (Apixaban)  Indication: pulmonary embolus  Allergies  Allergen Reactions  . Penicillins Hives   Patient Measurements: Height: 5\' 8"  (172.7 cm) Weight: 228 lb 2.8 oz (103.5 kg) IBW/kg (Calculated) : 68.4  Vital Signs: Temp: 98.2 F (36.8 C) (12/30 1200) Temp Source: Oral (12/30 1200) BP: 136/90 mmHg (12/30 1200) Pulse Rate: 102 (12/30 1200)  Recent Labs  05/21/15 1659 05/21/15 2007 05/22/15 0002 05/22/15 0437  HGB 18.5*  --   --  15.5  HCT 55.5*  --   --  46.2  PLT 139*  --   --  99*  APTT 26  --   --   --   LABPROT 15.5*  --   --   --   INR 1.21  --   --   --   HEPARINUNFRC  --   --   --  0.28*  CREATININE 1.08  --   --  1.12  TROPONINI 0.05* 0.06* 0.05* 0.14*   Estimated Creatinine Clearance: 90.9 mL/min (by C-G formula based on Cr of 1.12).  Medical History: Past Medical History  Diagnosis Date  . CHF (congestive heart failure) (HCC)   . Hypertension    Assessment: 49 yo active male with bilateral PE. Pharmacy consulted for dosing of Eliquis. Patient was started on heparin infusion on admission.   CrCl: 91 ml/min  Scr 1.12  Wt: 104 kg    Plan:  Discontinue heparin drip and start patient on Apixaban (Eliquis) 10mg  BID for 7 days, followed by apixaban 5mg  BID.   Pharmacy will continue to monitor per consult.   Cher NakaiSheema Galia Rahm, PharmD Pharmacy Resident  05/22/2015,12:48 PM

## 2015-05-22 NOTE — Progress Notes (Signed)
ANTICOAGULATION CONSULT NOTE - Initial Consult  Pharmacy Consult for Heparin   Indication: pulmonary embolus  Allergies  Allergen Reactions  . Penicillins Hives    Patient Measurements: Height: 5\' 8"  (172.7 cm) Weight: 228 lb 2.8 oz (103.5 kg) IBW/kg (Calculated) : 68.4 Heparin Dosing Weight: 90.5 kg   Vital Signs: Temp: 98.2 F (36.8 C) (12/29 2343) Temp Source: Oral (12/29 2343) BP: 116/97 mmHg (12/30 0200) Pulse Rate: 113 (12/30 0200)  Labs:  Recent Labs  05/21/15 1659 05/21/15 2007 05/22/15 0002 05/22/15 0437  HGB 18.5*  --   --  15.5  HCT 55.5*  --   --  46.2  PLT 139*  --   --  99*  APTT 26  --   --   --   LABPROT 15.5*  --   --   --   INR 1.21  --   --   --   HEPARINUNFRC  --   --   --  0.28*  CREATININE 1.08  --   --  1.12  TROPONINI 0.05* 0.06* 0.05*  --     Estimated Creatinine Clearance: 90.9 mL/min (by C-G formula based on Cr of 1.12).   Medical History: Past Medical History  Diagnosis Date  . CHF (congestive heart failure) (HCC)   . Hypertension     Medications:  Prescriptions prior to admission  Medication Sig Dispense Refill Last Dose  . aspirin EC 81 MG tablet Take 1 tablet by mouth daily.   05/21/2015 at Unknown time  . diclofenac (VOLTAREN) 75 MG EC tablet Take 1 tablet by mouth 2 (two) times daily.   05/21/2015 at Unknown time  . furosemide (LASIX) 40 MG tablet Take 1 tablet by mouth daily.   05/21/2015 at Unknown time  . gabapentin (NEURONTIN) 300 MG capsule Take 1 capsule by mouth daily.   05/21/2015 at Unknown time  . ibuprofen (ADVIL,MOTRIN) 200 MG tablet Take 1 tablet by mouth every 6 (six) hours as needed.   PRN at Unknown time  . Multiple Vitamin (MULTIVITAMIN WITH MINERALS) TABS tablet Take 1 tablet by mouth daily.   05/21/2015 at Unknown time  . pantoprazole (PROTONIX) 40 MG tablet Take 1 tablet by mouth daily.   05/21/2015 at Unknown time  . ramipril (ALTACE) 10 MG capsule Take 1 capsule by mouth daily.   05/21/2015 at Unknown  time  . verapamil (VERELAN PM) 240 MG 24 hr capsule Take 1 capsule by mouth daily.   05/21/2015 at Unknown time    Assessment: Pharmacy consulted to dose heparin in this 49 year old male admitted with P.E.  No prior anticoag noted.   Goal of Therapy:  Heparin level 0.3-0.7 units/ml Monitor platelets by anticoagulation protocol: Yes   Plan:  Give 5400 units bolus x 1 Start heparin infusion at 1500 units/hr  Will order baseline INR and aPTT. Will draw 1st HL 6 hrs after start of drip on 12/30 @ 0400.  12/30 AM heparin level 0.28. 1400 unit bolus and increase rate to 1700 units/hr. Recheck in 6 hours.  Lane Eland S 05/22/2015,5:26 AM

## 2015-05-22 NOTE — Progress Notes (Signed)
Notified jeanne,rn at [facility] concerning pt's elevated troponin level of 0.14.  No new orders were given.  Heparin drip was adjusted according to orders.  Pt is alert and oriented with no complaints of pain.

## 2015-05-22 NOTE — Progress Notes (Signed)
Patients oxygen saturation at 79% while at rest. Placed him back on 2 liters nasal cannula and will continue to assess and monitor.

## 2015-05-22 NOTE — Progress Notes (Signed)
[facility] [place] at [facility]   PATIENT NAME: Jermaine Blake    MR#:  [id number]  DATE OF BIRTH:  1965-11-06  SUBJECTIVE:  CHIEF COMPLAINT:  Patient denies any chest pain or shortness of breath today but became very short of breath and tachycardic with heart rate around 1:30 and a pulse ox at 87% with ambulation. Patient was not sedentary and walking 3 miles per day prior to this admission  REVIEW OF SYSTEMS:  CONSTITUTIONAL: No fever, fatigue or weakness.  EYES: No blurred or double vision.  EARS, NOSE, AND THROAT: No tinnitus or ear pain.  RESPIRATORY: No cough, shortness of breath, wheezing or hemoptysis.  CARDIOVASCULAR: No chest pain, orthopnea, edema.  GASTROINTESTINAL: No nausea, vomiting, diarrhea or abdominal pain.  GENITOURINARY: No dysuria, hematuria.  ENDOCRINE: No polyuria, nocturia,  HEMATOLOGY: No anemia, easy bruising or bleeding SKIN: No rash or lesion. MUSCULOSKELETAL: No joint pain or arthritis.   NEUROLOGIC: No tingling, numbness, weakness.  PSYCHIATRY: No anxiety or depression.   DRUG ALLERGIES:   Allergies  Allergen Reactions  . Penicillins Hives    VITALS:  Blood pressure 113/72, pulse 111, temperature 98.2 F (36.8 C), temperature source Oral, resp. rate 17, height  (1.727 m), weight 103.5 kg (228 lb 2.8 oz), SpO2 100 %.  PHYSICAL EXAMINATION:  GENERAL:  49 y.o.-year-old patient lying in the bed with no acute distress.  EYES: Pupils equal, round, reactive to light and accommodation. No scleral icterus. Extraocular muscles intact.  HEENT: Head atraumatic, normocephalic. Oropharynx and nasopharynx clear.  NECK:  Supple, no jugular venous distention. No thyroid enlargement, no tenderness.  LUNGS: Normal breath sounds bilaterally, no wheezing, rales,rhonchi or crepitation. No use of accessory muscles of respiration.  CARDIOVASCULAR: S1, S2 normal. No murmurs, rubs, or gallops.  ABDOMEN: Soft, nontender, nondistended.  Bowel sounds present. No organomegaly or mass.  EXTREMITIES: No calf  muscle tenderness No pedal edema, cyanosis, or clubbing.  NEUROLOGIC: Cranial nerves II through XII are intact. Muscle strength 5/5 in all extremities. Sensation intact. Gait not checked.  PSYCHIATRIC: The patient is alert and oriented x 3.  SKIN: No obvious rash, lesion, or ulcer.    LABORATORY PANEL:   CBC  Recent Labs Lab 05/22/15 0437  WBC 10.2  HGB 15.5  HCT 46.2  PLT 99*   ------------------------------------------------------------------------------------------------------------------  Chemistries   Recent Labs Lab 05/22/15 0002 05/22/15 0437  NA  --  139  K  --  4.1  CL  --  108  CO2  --  22  GLUCOSE  --  116*  BUN  --  10  CREATININE  --  1.12  CALCIUM  --  8.7*  MG 1.9  --    ------------------------------------------------------------------------------------------------------------------  Cardiac Enzymes  Recent Labs Lab 05/22/15 1239  TROPONINI <0.03   ------------------------------------------------------------------------------------------------------------------  RADIOLOGY:  Dg Chest 2 View  05/21/2015  CLINICAL DATA:  Shortness of breath EXAM: CHEST  2 VIEW COMPARISON:  07/13/2006 chest radiograph. FINDINGS: Stable cardiomediastinal silhouette with top-normal heart size. No pneumothorax. No pleural effusion. Clear lungs, with no focal lung consolidation and no pulmonary edema. IMPRESSION: No active cardiopulmonary disease. Electronically Signed   By: Delbert Phenix M.D.   On: 05/21/2015 17:42   Ct Angio Chest Pe W/cm &/or Wo Cm  05/21/2015  CLINICAL DATA:  Initial encounter for cough and difficulty breathing starting 3 days ago but worsening today. EXAM: CT ANGIOGRAPHY CHEST WITH CONTRAST TECHNIQUE: Multidetector CT imaging of the chest was performed using the standard  protocol during bolus administration of intravenous contrast. Multiplanar CT image reconstructions and MIPs were  obtained to evaluate the vascular anatomy. CONTRAST:  OMNIPAQUE IOHEXOL 350 MG/ML SOLN COMPARISON:  None. FINDINGS: Mediastinum / Lymph Nodes: There is no axillary lymphadenopathy. No mediastinal lymphadenopathy. There is no hilar lymphadenopathy. Heart size is normal. No pericardial effusion. The esophagus has normal imaging features. No thoracic aortic aneurysm. No dissection of the thoracic aorta. Large volume pulmonary embolus is evident in both main pulmonary arteries, extending into lobar and segmental pulmonary arteries of both upper and lower lobes. Thrombus is occlusive in some segmental branches bilaterally. There is paradoxical inversion of the interventricular septum and the RV / LV ratio is 1.9. Lungs / Pleura: Lung windows show no focal airspace consolidation. There is some atelectasis in the lower lungs bilaterally, left greater than right. No definite pulmonary infarct at this time. No pulmonary edema or pleural effusion. Upper Abdomen: Imaging through the upper abdomen suggests mild gallbladder wall thickening with potentially a small amount of pericholecystic fluid. Mild lymphadenopathy is associated in the hepatoduodenal ligament. MSK / Soft Tissues: Bone windows reveal no worrisome lytic or sclerotic osseous lesions. Review of the MIP images confirms the above findings. IMPRESSION: 1. Positive for acute PE with CTevidence of right heart strain (RV/LV Ratio = 1.9) consistent with at least submassive (intermediate risk) PE. The presence of right heart strain has been associated with an increased risk of morbidity and mortality. 2. Question mild gallbladder wall thickening with possible pericholecystic edema. Abdominal ultrasound could be used to further evaluate as clinically warranted. 3. Mild hepatoduodenal ligament lymphadenopathy. Critical Value/emergent results were called by me at the time of interpretation on 05/21/2015 at 9:12 pm to Dr. Phineas Semen , who verbally acknowledged  these results. Electronically Signed   By: Kennith Center M.D.   On: 05/21/2015 21:12   US Venous Img Lower Bilateral  05/22/2015  ADDENDUM REPORT: 05/22/2015 12:37 ADDENDUM: These results were called by telephone at the time of interpretation on 05/22/2015 at 12:20 pm to Dr. Ramonita Lab , who verbally acknowledged these results. Electronically Signed   By: Bary Richard M.D.   On: 05/22/2015 12:37  05/22/2015  CLINICAL DATA:  Pulmonary emboli. EXAM: BILATERAL LOWER EXTREMITY VENOUS DOPPLER ULTRASOUND TECHNIQUE: Gray-scale sonography with graded compression, as well as color Doppler and duplex ultrasound were performed to evaluate the lower extremity deep venous systems from the level of the common femoral vein and including the common femoral, femoral, profunda femoral, popliteal and calf veins including the posterior tibial, peroneal and gastrocnemius veins when visible. The superficial great saphenous vein was also interrogated. Spectral Doppler was utilized to evaluate flow at rest and with distal augmentation maneuvers in the common femoral, femoral and popliteal veins. COMPARISON:  None. FINDINGS: RIGHT LOWER EXTREMITY Common Femoral Vein: No evidence of thrombus. Normal compressibility, respiratory phasicity and response to augmentation. Saphenofemoral Junction: No evidence of thrombus. Normal compressibility and flow on color Doppler imaging. Profunda Femoral Vein: No evidence of thrombus. Normal compressibility and flow on color Doppler imaging. Femoral Vein: Nonocclusive thrombus within the proximal right superficial femoral vein. Popliteal Vein: Nonocclusive thrombus throughout the right popliteal vein. Calf Veins: No evidence of thrombus. Normal compressibility and flow on color Doppler imaging. Superficial Great Saphenous Vein: No evidence of thrombus. Normal compressibility and flow on color Doppler imaging. Venous Reflux:  None. Other Findings:  None. LEFT LOWER EXTREMITY Common Femoral Vein: No  evidence of thrombus. Normal compressibility, respiratory phasicity and response to augmentation. Saphenofemoral  Junction: No evidence of thrombus. Normal compressibility and flow on color Doppler imaging. Profunda Femoral Vein: No evidence of thrombus. Normal compressibility and flow on color Doppler imaging. Femoral Vein: No evidence of thrombus. Normal compressibility, respiratory phasicity and response to augmentation. Popliteal Vein: No evidence of thrombus. Normal compressibility, respiratory phasicity and response to augmentation. Calf Veins: No evidence of thrombus. Normal compressibility and flow on color Doppler imaging. Superficial Great Saphenous Vein: No evidence of thrombus. Normal compressibility and flow on color Doppler imaging. Venous Reflux:  None. Other Findings:  None. IMPRESSION: Nonocclusive deep venous thrombosis within the RIGHT femoral vein and popliteal vein. No deep venous thrombosis identified within the left lower extremity. Electronically Signed: By: Bary RichardStan  Maynard M.D. On: 05/22/2015 12:09    EKG:   Orders placed or performed during the hospital encounter of 05/21/15  . EKG 12-Lead  . EKG 12-Lead  . EKG 12-Lead  . EKG 12-Lead    ASSESSMENT AND PLAN:   49 year old Caucasian gentleman history of essential hypertension who is presenting with shortness of breath, found to have pulmonary embolism  1 acute pulmonary embolus probably from right femoral and popliteal DVT  There was evidence of right heart strain suggested by CT .  Pt  does not qualify for any advanced interventional procedure or TPA given he is hemodynamically stable.  He has been placed on heparin drip,will change to NOAC -Eliquis Appreciate pulmonology recommendations Troponin is elevated from pulmonary embolism and underlying right lower extremity DVT Patient is tachycardic and became desaturated with ambulation today. Will continue oxygen and reassess him in a.m. Consults oncology Echo pending     2. Essential hypertension: Continue home medications  3. GERD without esophagitis PPI therapy  4. Venous thromboembolism prophylactic: Therapeutic heparin     All the records are reviewed and case discussed with Care Management/Social Workerr. Management plans discussed with the patient, family and they are in agreement.  CODE STATUS: fc  TOTAL critical TIME TAKING CARE OF THIS PATIENT: 35  minutes.   POSSIBLE D/C IN 1-2 DAYS, DEPENDING ON CLINICAL CONDITION.   Ramonita LabGouru, Obadiah Dennard M.D on 05/22/2015 at 2:10 PM  Between 7am to 6pm - Pager - [phone] After 6pm go to [email or web address] - password [email or web address] [facility]  [facility]  Office  [phone]  CC: Primary care physician; Danella PentonMILLER,MARK F., MD

## 2015-05-23 ENCOUNTER — Inpatient Hospital Stay (HOSPITAL_COMMUNITY)
Admission: AD | Admit: 2015-05-23 | Discharge: 2015-05-25 | DRG: 175 | Disposition: A | Discharge status: Home / Self Care | Payer: BLUE CROSS/BLUE SHIELD | Source: Other Acute Inpatient Hospital | Attending: Pulmonary Disease | Admitting: Pulmonary Disease

## 2015-05-23 ENCOUNTER — Inpatient Hospital Stay (HOSPITAL_COMMUNITY)
Admit: 2015-05-23 | Discharge: 2015-05-23 | Disposition: A | Discharge status: Home / Self Care | Payer: BLUE CROSS/BLUE SHIELD | Attending: Cardiovascular Disease | Admitting: Cardiovascular Disease

## 2015-05-23 ENCOUNTER — Encounter (HOSPITAL_COMMUNITY): Payer: Self-pay | Admitting: Pulmonary Disease

## 2015-05-23 DIAGNOSIS — I959 Hypotension, unspecified: Secondary | ICD-10-CM | POA: Diagnosis present

## 2015-05-23 DIAGNOSIS — R609 Edema, unspecified: Secondary | ICD-10-CM | POA: Diagnosis not present

## 2015-05-23 DIAGNOSIS — I2602 Saddle embolus of pulmonary artery with acute cor pulmonale: Secondary | ICD-10-CM | POA: Diagnosis not present

## 2015-05-23 DIAGNOSIS — I2692 Saddle embolus of pulmonary artery without acute cor pulmonale: Secondary | ICD-10-CM | POA: Diagnosis not present

## 2015-05-23 DIAGNOSIS — I517 Cardiomegaly: Secondary | ICD-10-CM

## 2015-05-23 DIAGNOSIS — Z7982 Long term (current) use of aspirin: Secondary | ICD-10-CM

## 2015-05-23 DIAGNOSIS — J9601 Acute respiratory failure with hypoxia: Secondary | ICD-10-CM | POA: Diagnosis present

## 2015-05-23 DIAGNOSIS — I82401 Acute embolism and thrombosis of unspecified deep veins of right lower extremity: Secondary | ICD-10-CM | POA: Diagnosis present

## 2015-05-23 DIAGNOSIS — R0902 Hypoxemia: Secondary | ICD-10-CM

## 2015-05-23 DIAGNOSIS — Z79899 Other long term (current) drug therapy: Secondary | ICD-10-CM

## 2015-05-23 DIAGNOSIS — I82411 Acute embolism and thrombosis of right femoral vein: Secondary | ICD-10-CM

## 2015-05-23 DIAGNOSIS — R0600 Dyspnea, unspecified: Secondary | ICD-10-CM | POA: Diagnosis not present

## 2015-05-23 DIAGNOSIS — I10 Essential (primary) hypertension: Secondary | ICD-10-CM | POA: Diagnosis present

## 2015-05-23 DIAGNOSIS — Z880 Allergy status to penicillin: Secondary | ICD-10-CM

## 2015-05-23 DIAGNOSIS — I2699 Other pulmonary embolism without acute cor pulmonale: Secondary | ICD-10-CM

## 2015-05-23 DIAGNOSIS — D696 Thrombocytopenia, unspecified: Secondary | ICD-10-CM | POA: Diagnosis present

## 2015-05-23 DIAGNOSIS — I519 Heart disease, unspecified: Secondary | ICD-10-CM | POA: Diagnosis present

## 2015-05-23 DIAGNOSIS — I2609 Other pulmonary embolism with acute cor pulmonale: Secondary | ICD-10-CM

## 2015-05-23 DIAGNOSIS — I824Z9 Acute embolism and thrombosis of unspecified deep veins of unspecified distal lower extremity: Secondary | ICD-10-CM | POA: Diagnosis not present

## 2015-05-23 DIAGNOSIS — R0603 Acute respiratory distress: Secondary | ICD-10-CM | POA: Insufficient documentation

## 2015-05-23 DIAGNOSIS — E861 Hypovolemia: Secondary | ICD-10-CM | POA: Diagnosis present

## 2015-05-23 LAB — CBC
HEMATOCRIT: 45.8 % (ref 40.0–52.0)
Hemoglobin: 15.2 g/dL (ref 13.0–18.0)
MCH: 30.9 pg (ref 26.0–34.0)
MCHC: 33.1 g/dL (ref 32.0–36.0)
MCV: 93.1 fL (ref 80.0–100.0)
PLATELETS: 115 10*3/uL — AB (ref 150–440)
RBC: 4.92 MIL/uL (ref 4.40–5.90)
RDW: 14.6 % — AB (ref 11.5–14.5)
WBC: 8.5 10*3/uL (ref 3.8–10.6)

## 2015-05-23 LAB — BASIC METABOLIC PANEL
ANION GAP: 9 (ref 5–15)
BUN: 12 mg/dL (ref 6–20)
CALCIUM: 9 mg/dL (ref 8.9–10.3)
CO2: 23 mmol/L (ref 22–32)
CREATININE: 1.1 mg/dL (ref 0.61–1.24)
Chloride: 108 mmol/L (ref 101–111)
GFR calc Af Amer: >60 mL/min (ref >60)
GLUCOSE: 109 mg/dL — AB (ref 65–99)
Potassium: 3.6 mmol/L (ref 3.5–5.1)
Sodium: 140 mmol/L (ref 135–145)

## 2015-05-23 LAB — TROPONIN I: Troponin I: <0.03 ng/mL (ref <0.031)

## 2015-05-23 MED ORDER — HEPARIN (PORCINE) IN NACL 100-0.45 UNIT/ML-% IJ SOLN
1600.0000 [IU]/h | INTRAMUSCULAR | Status: DC
  Administered 2015-05-23: 1600 [IU]/h via INTRAVENOUS
  Filled 2015-05-23 (×2): qty 250

## 2015-05-23 MED ORDER — SODIUM CHLORIDE 0.9 % IV BOLUS (SEPSIS)
500.0000 mL | INTRAVENOUS | Status: AC
  Administered 2015-05-23: 500 mL via INTRAVENOUS

## 2015-05-23 MED ORDER — SODIUM CHLORIDE 0.9 % IV BOLUS (SEPSIS): 500.0000 mL | Freq: Once | INTRAVENOUS | Status: AC

## 2015-05-23 MED ORDER — PANTOPRAZOLE SODIUM 40 MG PO TBEC
40.0000 mg | DELAYED_RELEASE_TABLET | Freq: Every day | ORAL | Status: DC
  Administered 2015-05-24 – 2015-05-25 (×2): 40 mg via ORAL
  Filled 2015-05-23 (×2): qty 1

## 2015-05-23 MED ORDER — ADULT MULTIVITAMIN W/MINERALS CH
1.0000 | ORAL_TABLET | Freq: Every day | ORAL | Status: DC
Start: 2015-05-23 — End: 2015-05-25
  Administered 2015-05-24 – 2015-05-25 (×2): 1 via ORAL
  Filled 2015-05-23 (×2): qty 1

## 2015-05-23 MED ORDER — ACETAMINOPHEN 325 MG PO TABS: 650.0000 mg | ORAL_TABLET | Freq: Four times a day (QID) | ORAL | Status: DC | PRN

## 2015-05-23 MED ORDER — LACTATED RINGERS IV SOLN
INTRAVENOUS | Status: DC
  Administered 2015-05-23: 19:00:00 via INTRAVENOUS

## 2015-05-23 MED ORDER — GABAPENTIN 300 MG PO CAPS: 300.0000 mg | ORAL_CAPSULE | Freq: Every day | ORAL | Status: DC

## 2015-05-23 NOTE — Consult Note (Signed)
Consult Note  Patient name: Jermaine Blake MRN: [id number] DOB: [birth year] Sex: male  Consulting Physician:  Hospitalist service  Reason for Consult:  Chief Complaint  Patient presents with  . Cough  . Shortness of Breath  . Irregular Heart Beat    HISTORY OF PRESENT ILLNESS: This is a 49 year old gentleman who was admitted on 1229 with complaints of shortness of breath.  This is been going on for several days.  He also complained of some mild chest pain.  Ultrasound revealed right femoral vein DVT.  CT scan showed acute PE with right heart strain.  He was admitted to the ICU and started on anticoagulation.  He was stable yesterday, however today after walking he became hypotensive with blood pressure in the 60s and 70s.  He is not complaining of shortness of breath but did drop his oxygen saturation saturations into the mid 70s without oxygen.  He is not having any more chest pain.Marland Kitchen.  He denies a previous history of DVT.  There is no family history of DVT.  He denies any prolonged traveling, recent changes in his medication The patient has a history of congestive heart failure and hypertension  Past Medical History  Diagnosis Date  . CHF (congestive heart failure) (HCC)   . Hypertension     History reviewed. No pertinent past surgical history.  Social History   Social History  . Marital Status: Single    Spouse Name: N/A  . Number of Children: N/A  . Years of Education: N/A   Occupational History  . Not on file.   Social History Main Topics  . Smoking status: Never Smoker   . Smokeless tobacco: Not on file  . Alcohol Use: Yes     Comment: socially  . Drug Use: No  . Sexual Activity: Not on file   Other Topics Concern  . Not on file   Social History Narrative  . No narrative on file    Family History  Problem Relation Age of Onset  . Hypertension Other     Allergies as of 05/21/2015 - Review Complete 05/21/2015  Allergen Reaction Noted  .  Penicillins Hives 05/21/2015    No current facility-administered medications on file prior to encounter.   No current outpatient prescriptions on file prior to encounter.     REVIEW OF SYSTEMS: Cardiovascular: No chest pain, chest pressure, palpitations, orthopnea, or dyspnea on exertion. No claudication or rest pain,  No history of DVT or phlebitis. Pulmonary: No productive cough, asthma or wheezing. Neurologic: No weakness, paresthesias, aphasia, or amaurosis. No dizziness. Hematologic: No bleeding problems or clotting disorders. Musculoskeletal: No joint pain or joint swelling. Gastrointestinal: No blood in stool or hematemesis Genitourinary: No dysuria or hematuria. Psychiatric:: No history of major depression. Integumentary: No rashes or ulcers. Constitutional: No fever or chills.  PHYSICAL EXAMINATION: General: The patient appears their stated age.  He is somewhat diaphoretic Vital signs are BP 105/78 mmHg  Pulse 100  Temp(Src) 98.4 F (36.9 C) (Oral)  Resp 18  Ht 5\' 8"  (1.727 m)  Wt 228 lb 2.8 oz (103.5 kg)  BMI 34.70 kg/m2  SpO2 94% Pulmonary: Respirations are non-labored HEENT:  No gross abnormalities Musculoskeletal: There are no major deformities.   Neurologic: No focal weakness or paresthesias are detected, Skin: There are no ulcer or rashes noted. Psychiatric: The patient has normal affect. Cardiovascular: There is a regular rate and rhythm   Diagnostic Studies: I have reviewed his  ultrasound shows nonocclusive right femoral vein and popliteal vein DVT.  I have reviewed his CT scan which showed large volume PE and both main pulmonary arteries extending into the lobar and segmental pulmonary arteries of bilateral upper and lower lobes.  There is radiographic evidence of right heart strain   Assessment:  DVT with PE Plan: The patient had been doing well, however today after walking he became hypotensive and somewhat diaphoretic.  He is not currently having  chest pain.  There is concern that he has had worsening clot burden in his lungs.  I think he needs to be evaluated for probable lysis either catheter directed or systemic.  The patient's blood pressure has somewhat stabilized, now with systolic pressures greater than 100.  Previously they had been in the 60 range.  I have spoken with interventional radiology at [facility] as well as radical care physicians.  Plans will be made to transfer the patient to come in for further therapy.  His sister was at the bedside for discussions and she agrees.     Jorge Ny, M.D. [facility] Office: [phone] Pager:  [phone]

## 2015-05-23 NOTE — Progress Notes (Signed)
ANTICOAGULATION CONSULT NOTE - Initial Consult  Pharmacy Consult for Heparin  Indication: pulmonary embolus  Allergies  Allergen Reactions  . Penicillins Hives    Patient Measurements: TBW 103.5 kg IBW 68.4 kg Heparin Dosing Weight: 91 kg  Vital Signs: Temp: 98.4 F (36.9 C) (12/31 1200) Temp Source: Oral (12/31 1200) BP: 107/88 mmHg (12/31 1515) Pulse Rate: 96 (12/31 1515)  Labs:  Recent Labs  05/21/15 1659  05/22/15 0437 05/22/15 1239 05/23/15 0647 05/23/15 1233  HGB 18.5*  --  15.5  --  15.2  --   HCT 55.5*  --  46.2  --  45.8  --   PLT 139*  --  99*  --  115*  --   APTT 26  --   --   --   --   --   LABPROT 15.5*  --   --   --   --   --   INR 1.21  --   --   --   --   --   HEPARINUNFRC  --   --  0.28*  --   --   --   CREATININE 1.08  --  1.12  --  1.10  --   TROPONINI 0.05*  < > 0.14* <0.03  --  <0.03  < > = values in this interval not displayed.  Estimated Creatinine Clearance: 92.6 mL/min (by C-G formula based on Cr of 1.1).   Medical History: Past Medical History  Diagnosis Date  . CHF (congestive heart failure) (HCC)   . Hypertension     Assessment: 49 [facility] [place] [facility] [place]. Went to the ED on 12/29 with complaints of SOB, found to have a PE. Considering thrombolysis [facility] Pharmacy consulted to start heparin gtt for PE. Started on Eliquis 10mg  PO BID which was started yesterday and last dose was this am at 0900. Was on heparin gtt at [place] at 1,500 units/hr and first level was low at 0.28. CBC stable, no s/s of bleed.  Goal of Therapy:  aPTT 66-102 seconds Heparin level 0.3-0.7 units/ml Monitor platelets by anticoagulation protocol: Yes   Plan:  No heparin BOLUS Start heparin gtt at 1,600 units/hr at 2100 tonight Check 6 hr HL Monitor daily HL, CBC, s/s of bleed F/U plans for thrombolysis  Enzo BiNathan Citlaly Camplin, PharmD, [facility] Clinical Pharmacist Pager [phone] 05/23/2015 4:42 PM

## 2015-05-23 NOTE — Consult Note (Signed)
PULMONARY CONSULT   PATIENT NAME: Jermaine Blake    MR#:  [id number]  DATE OF BIRTH:  [birth year]   DATE OF ADMISSION:  05/21/2015  PRIMARY CARE PHYSICIAN: Danella Penton., MD   REQUESTING/REFERRING PHYSICIAN: HOWER  CHIEF COMPLAINT:   Chief Complaint  Patient presents with  . Cough  . Shortness of Breath  . Irregular Heart Beat    HISTORY OF PRESENT ILLNESS:  Patient did well last night until he ambulated this afternoon,  patient with sudden drop in BP, diaphoretic, patient given Bolus NS 500 cc Patient had been getting BP meds, Elliqous was started yesterday, will stop and restart heparin 12 hrs after last elliquos dose  vascular Surgery and Cardiology saw patient and recommend transfer to [facility] for catheter directed therapy Will hold all BP meds now  I have discussed case with Dr Craige Cotta and will be transferring to ICU for further therapeutic management   REVIEW OF SYSTEMS:  REVIEW OF SYSTEMS:  CONSTITUTIONAL: Denies fevers, chills, fatigue, +weakness.  EYES: Denies blurred vision, double vision, or eye pain.  EARS, NOSE, THROAT: Denies tinnitus, ear pain, hearing loss.  RESPIRATORY: Positive cough, +shortness of breath, tinnitus wheezing  CARDIOVASCULAR: Positive chest pain, denies palpitations, edema.  GASTROINTESTINAL: Denies nausea, vomiting, diarrhea, abdominal pain.  NEUROLOGIC: Denies paralysis, paresthesias.  Otherwise full review of systems is negative.   MEDICATIONS AT HOME:   Prior to Admission medications   Medication Sig Start Date End Date Taking? Authorizing Provider  aspirin EC 81 MG tablet Take 1 tablet by mouth daily.   Yes Historical Provider, MD  diclofenac (VOLTAREN) 75 MG EC tablet Take 1 tablet by mouth 2 (two) times daily.   Yes Historical Provider, MD  furosemide (LASIX) 40 MG tablet Take 1 tablet by mouth daily. 07/31/14  Yes Historical Provider, MD  gabapentin (NEURONTIN) 300 MG capsule Take 1 capsule by mouth daily.   Yes Historical  Provider, MD  ibuprofen (ADVIL,MOTRIN) 200 MG tablet Take 1 tablet by mouth every 6 (six) hours as needed.   Yes Historical Provider, MD  Multiple Vitamin (MULTIVITAMIN WITH MINERALS) TABS tablet Take 1 tablet by mouth daily.   Yes Historical Provider, MD  pantoprazole (PROTONIX) 40 MG tablet Take 1 tablet by mouth daily. 07/31/14  Yes Historical Provider, MD  ramipril (ALTACE) 10 MG capsule Take 1 capsule by mouth daily. 07/31/14  Yes Historical Provider, MD  verapamil (VERELAN PM) 240 MG 24 hr capsule Take 1 capsule by mouth daily. 07/31/14  Yes Historical Provider, MD      VITAL SIGNS:  Blood pressure 105/78, pulse 100, temperature 98.4 F (36.9 C), temperature source Oral, resp. rate 18, height  (1.727 m), weight 228 lb 2.8 oz (103.5 kg), SpO2 94 %.  PHYSICAL EXAMINATION:  VITAL SIGNS: Filed Vitals:   05/23/15 1330 05/23/15 1345  BP: 108/73 105/78  Pulse: 101 100  Temp:    Resp: 18 18   GENERAL:49 y.o.male currently in some distress acute distress.  HEAD: Normocephalic, atraumatic.  EYES: Pupils equal, round, reactive to light. Extraocular muscles intact. No scleral icterus.  MOUTH: Moist mucosal membrane. Dentition intact. No abscess noted.  EAR, NOSE, THROAT: Clear without exudates. No external lesions.  NECK: Supple. No thyromegaly. No nodules. No JVD.  PULMONARY: Clear to ascultation, without wheeze rails or rhonci. No use of accessory muscles, Good respiratory effort. good air entry bilaterally CHEST: Nontender to palpation.  CARDIOVASCULAR: S1 and S2. Tachycardic. No murmurs, rubs, or gallops. No edema. Pedal pulses 2+ bilaterally.  GASTROINTESTINAL:  Soft, nontender, nondistended. No masses. Positive bowel sounds. No hepatosplenomegaly.    LABORATORY PANEL:   CBC  Recent Labs Lab 05/23/15 0647  WBC 8.5  HGB 15.2  HCT 45.8  PLT 115*    ------------------------------------------------------------------------------------------------------------------  Chemistries   Recent Labs Lab 05/22/15 0002  05/23/15 0647  NA  --   < > 140  K  --   < > 3.6  CL  --   < > 108  CO2  --   < > 23  GLUCOSE  --   < > 109*  BUN  --   < > 12  CREATININE  --   < > 1.10  CALCIUM  --   < > 9.0  MG 1.9  --   --   < > = values in this interval not displayed. ------------------------------------------------------------------------------------------------------------------  Cardiac Enzymes  Recent Labs Lab 05/23/15 1233  TROPONINI <0.03   ------------------------------------------------------------------------------------------------------------------  RADIOLOGY:  Dg Chest 2 View  05/21/2015  CLINICAL DATA:  Shortness of breath EXAM: CHEST  2 VIEW COMPARISON:  07/13/2006 chest radiograph. FINDINGS: Stable cardiomediastinal silhouette with top-normal heart size. No pneumothorax. No pleural effusion. Clear lungs, with no focal lung consolidation and no pulmonary edema. IMPRESSION: No active cardiopulmonary disease. Electronically Signed   By: Delbert Phenix M.D.   On: 05/21/2015 17:42   Ct Angio Chest Pe W/cm &/or Wo Cm  05/21/2015  CLINICAL DATA:  Initial encounter for cough and difficulty breathing starting 3 days ago but worsening today. EXAM: CT ANGIOGRAPHY CHEST WITH CONTRAST TECHNIQUE: Multidetector CT imaging of the chest was performed using the standard protocol during bolus administration of intravenous contrast. Multiplanar CT image reconstructions and MIPs were obtained to evaluate the vascular anatomy. CONTRAST:  OMNIPAQUE IOHEXOL 350 MG/ML SOLN COMPARISON:  None. FINDINGS: Mediastinum / Lymph Nodes: There is no axillary lymphadenopathy. No mediastinal lymphadenopathy. There is no hilar lymphadenopathy. Heart size is normal. No pericardial effusion. The esophagus has normal imaging features. No thoracic aortic aneurysm. No  dissection of the thoracic aorta. Large volume pulmonary embolus is evident in both main pulmonary arteries, extending into lobar and segmental pulmonary arteries of both upper and lower lobes. Thrombus is occlusive in some segmental branches bilaterally. There is paradoxical inversion of the interventricular septum and the RV / LV ratio is 1.9. Lungs / Pleura: Lung windows show no focal airspace consolidation. There is some atelectasis in the lower lungs bilaterally, left greater than right. No definite pulmonary infarct at this time. No pulmonary edema or pleural effusion. Upper Abdomen: Imaging through the upper abdomen suggests mild gallbladder wall thickening with potentially a small amount of pericholecystic fluid. Mild lymphadenopathy is associated in the hepatoduodenal ligament. MSK / Soft Tissues: Bone windows reveal no worrisome lytic or sclerotic osseous lesions. Review of the MIP images confirms the above findings. IMPRESSION: 1. Positive for acute PE with CTevidence of right heart strain (RV/LV Ratio = 1.9) consistent with at least submassive (intermediate risk) PE. The presence of right heart strain has been associated with an increased risk of morbidity and mortality. 2. Question mild gallbladder wall thickening with possible pericholecystic edema. Abdominal ultrasound could be used to further evaluate as clinically warranted. 3. Mild hepatoduodenal ligament lymphadenopathy. Critical Value/emergent results were called by me at the time of interpretation on 05/21/2015 at 9:12 pm to Dr. Phineas Semen , who verbally acknowledged these results. Electronically Signed   By: Kennith Center M.D.   On: 05/21/2015 21:12   US Venous Img Lower Bilateral  05/22/2015  ADDENDUM REPORT: 05/22/2015 12:37 ADDENDUM: These results were called by telephone at the time of interpretation on 05/22/2015 at 12:20 pm to Dr. Ramonita LabARUNA GOURU , who verbally acknowledged these results. Electronically Signed   By: Bary RichardStan  Maynard  M.D.   On: 05/22/2015 12:37  05/22/2015  CLINICAL DATA:  Pulmonary emboli. EXAM: BILATERAL LOWER EXTREMITY VENOUS DOPPLER ULTRASOUND TECHNIQUE: Gray-scale sonography with graded compression, as well as color Doppler and duplex ultrasound were performed to evaluate the lower extremity deep venous systems from the level of the common femoral vein and including the common femoral, femoral, profunda femoral, popliteal and calf veins including the posterior tibial, peroneal and gastrocnemius veins when visible. The superficial great saphenous vein was also interrogated. Spectral Doppler was utilized to evaluate flow at rest and with distal augmentation maneuvers in the common femoral, femoral and popliteal veins. COMPARISON:  None. FINDINGS: RIGHT LOWER EXTREMITY Common Femoral Vein: No evidence of thrombus. Normal compressibility, respiratory phasicity and response to augmentation. Saphenofemoral Junction: No evidence of thrombus. Normal compressibility and flow on color Doppler imaging. Profunda Femoral Vein: No evidence of thrombus. Normal compressibility and flow on color Doppler imaging. Femoral Vein: Nonocclusive thrombus within the proximal right superficial femoral vein. Popliteal Vein: Nonocclusive thrombus throughout the right popliteal vein. Calf Veins: No evidence of thrombus. Normal compressibility and flow on color Doppler imaging. Superficial Great Saphenous Vein: No evidence of thrombus. Normal compressibility and flow on color Doppler imaging. Venous Reflux:  None. Other Findings:  None. LEFT LOWER EXTREMITY Common Femoral Vein: No evidence of thrombus. Normal compressibility, respiratory phasicity and response to augmentation. Saphenofemoral Junction: No evidence of thrombus. Normal compressibility and flow on color Doppler imaging. Profunda Femoral Vein: No evidence of thrombus. Normal compressibility and flow on color Doppler imaging. Femoral Vein: No evidence of thrombus. Normal compressibility,  respiratory phasicity and response to augmentation. Popliteal Vein: No evidence of thrombus. Normal compressibility, respiratory phasicity and response to augmentation. Calf Veins: No evidence of thrombus. Normal compressibility and flow on color Doppler imaging. Superficial Great Saphenous Vein: No evidence of thrombus. Normal compressibility and flow on color Doppler imaging. Venous Reflux:  None. Other Findings:  None. IMPRESSION: Nonocclusive deep venous thrombosis within the RIGHT femoral vein and popliteal vein. No deep venous thrombosis identified within the left lower extremity. Electronically Signed: By: Bary RichardStan  Maynard M.D. On: 05/22/2015 12:09    EKG:   Orders placed or performed during the hospital encounter of 05/21/15  . EKG 12-Lead  . EKG 12-Lead  . EKG 12-Lead  . EKG 12-Lead    IMPRESSION AND PLAN:   49 year old WM with acute B/L PE Patient with drop in BP, [facility] therapy  1.continue anticoagulation therapy with heparin 2.oxygen as needed 3.recommend Hem/ONC referral and can be done as outpatient 4.hold all BP meds   Case discussed with Dr Craige CottaSood I have personally obtained a history, examined the patient, evaluated Pertinent laboratory and RadioGraphic/imaging results, and  formulated the assessment and plan  The Patient requires high complexity decision making for assessment and support, frequent evaluation and titration of therapies. Patient satisfied with Plan of action and management. All questions answered  Lucie LeatherKurian David Noura Purpura, M.D.  [facility]  Medical Director [facility] Medical Director [facility]

## 2015-05-23 NOTE — Progress Notes (Signed)
[facility]. Patient is being transferred to 2S Room 1. Patient received a second 500 cc bolus of NS for hypotension and pressure is now 108/86 (93), heart rate 100, oxygen saturation is 97%, respiratory rate 17. He remains alert, oriented, and no complaints of pain. Will continue to assess and monitor.

## 2015-05-23 NOTE — Consult Note (Addendum)
Cardiology Consultation Note  Patient ID: Jermaine Blake, MRN: [id number], DOB/AGE: November 08, 1965 49 y.o. Admit date: 05/21/2015   Date of Consult: 05/23/2015 Primary Physician: Danella Penton., MD Primary Cardiologist:   Chief Complaint: SOB Reason for Consult: PE, hemodynamic instability, consult by Dr. Amado Coe  HPI: 49 y.o. male with h/o hypertension, diastolic congestive heart failure  presenting with shortness of breath and cough.CT documenting large B/L PE in the ER, started on anticoagulation.   On arrival he reported having shortness of breath for a few days prior to admission, symptoms got worse, developing at rest. Cough nonproductive however did have an episode of hemoptysis.  episode of pleuritic chest pain left lower chest and mid axillary line which  resolved. Primary care physician suggested he go to the ER Prior to admission patient was walking 3 miles per day  He initially did not meet criteria for TPA per the notes Lower extremity Dopplers confirmed nonocclusive clot in the right femoral and popliteal vein Pulse ox 87% on ambulation, tachycardia noted, rate to 130 bpm  Echocardiogram with severely elevated right heart pressures, 70 mm Hg, dilated RV, depressed systolic function Patient was started on high-dose eliquis twice a day, received a dose this morning  Has contacted by hospitalist service that after ambulation, he became acutely hypotensive systolic pressure 60-70. Placed back in bed, given IV fluid bolus 500 cc. Was still hypotensive in a supine position.   repositioning of the blood pressure cuff by myself,  blood pressure improved to 109 systolic     Past Medical History  Diagnosis Date  . CHF (congestive heart failure) (HCC)   . Hypertension       Most Recent Cardiac Studies: See details below concerning echocardiogram and CT scan results    Surgical History: History reviewed. No pertinent past surgical history.   Home Meds: Prior to Admission  medications   Medication Sig Start Date End Date Taking? Authorizing Provider  aspirin EC 81 MG tablet Take 1 tablet by mouth daily.   Yes Historical Provider, MD  diclofenac (VOLTAREN) 75 MG EC tablet Take 1 tablet by mouth 2 (two) times daily.   Yes Historical Provider, MD  furosemide (LASIX) 40 MG tablet Take 1 tablet by mouth daily. 07/31/14  Yes Historical Provider, MD  gabapentin (NEURONTIN) 300 MG capsule Take 1 capsule by mouth daily.   Yes Historical Provider, MD  ibuprofen (ADVIL,MOTRIN) 200 MG tablet Take 1 tablet by mouth every 6 (six) hours as needed.   Yes Historical Provider, MD  Multiple Vitamin (MULTIVITAMIN WITH MINERALS) TABS tablet Take 1 tablet by mouth daily.   Yes Historical Provider, MD  pantoprazole (PROTONIX) 40 MG tablet Take 1 tablet by mouth daily. 07/31/14  Yes Historical Provider, MD  ramipril (ALTACE) 10 MG capsule Take 1 capsule by mouth daily. 07/31/14  Yes Historical Provider, MD  verapamil (VERELAN PM) 240 MG 24 hr capsule Take 1 capsule by mouth daily. 07/31/14  Yes Historical Provider, MD    Inpatient Medications:  . antiseptic oral rinse  7 mL Mouth Rinse BID  . apixaban  10 mg Oral BID   Followed by  . [START ON 05/29/2015] apixaban  5 mg Oral BID  . aspirin EC  81 mg Oral Daily  . furosemide  40 mg Oral Daily  . multivitamin with minerals  1 tablet Oral Daily  . pantoprazole  40 mg Oral Daily  . ramipril  10 mg Oral Daily  . sodium chloride  3 mL Intravenous Q12H  .  verapamil  240 mg Oral Daily      Allergies:  Allergies  Allergen Reactions  . Penicillins Hives    Social History   Social History  . Marital Status: Single    Spouse Name: N/A  . Number of Children: N/A  . Years of Education: N/A   Occupational History  . Not on file.   Social History Main Topics  . Smoking status: Never Smoker   . Smokeless tobacco: Not on file  . Alcohol Use: Yes     Comment: socially  . Drug Use: No  . Sexual Activity: Not on file   Other Topics  Concern  . Not on file   Social History Narrative  . No narrative on file     Family History  Problem Relation Age of Onset  . Hypertension Other      Review of Systems: Review of Systems  Constitutional: Positive for malaise/fatigue.  Respiratory: Positive for shortness of breath.   Cardiovascular: Negative.   Gastrointestinal: Negative.   Musculoskeletal: Negative.   Neurological: Negative.   Psychiatric/Behavioral: Negative.   All other systems reviewed and are negative.    Labs:  Recent Labs  05/21/15 2007 05/22/15 0002 05/22/15 0437 05/22/15 1239  TROPONINI 0.06* 0.05* 0.14* <0.03   Lab Results  Component Value Date   WBC 8.5 05/23/2015   HGB 15.2 05/23/2015   HCT 45.8 05/23/2015   MCV 93.1 05/23/2015   PLT 115* 05/23/2015    Recent Labs Lab 05/23/15 0647  NA 140  K 3.6  CL 108  CO2 23  BUN 12  CREATININE 1.10  CALCIUM 9.0  GLUCOSE 109*   No results found for: CHOL, HDL, LDLCALC, TRIG No results found for: DDIMER  Radiology/Studies:  Dg Chest 2 View  05/21/2015  CLINICAL DATA:  Shortness of breath EXAM: CHEST  2 VIEW COMPARISON:  07/13/2006 chest radiograph. FINDINGS: Stable cardiomediastinal silhouette with top-normal heart size. No pneumothorax. No pleural effusion. Clear lungs, with no focal lung consolidation and no pulmonary edema. IMPRESSION: No active cardiopulmonary disease. Electronically Signed   By: Delbert PhenixJason A Poff M.D.   On: 05/21/2015 17:42   Ct Angio Chest Pe W/cm &/or Wo Cm  05/21/2015  CLINICAL DATA:   IMPRESSION: 1. Positive for acute PE with CTevidence of right heart strain (RV/LV Ratio = 1.9) consistent with at least submassive (intermediate risk) PE. The presence of right heart strain has been associated with an increased risk of morbidity and mortality. 2. Question mild gallbladder wall thickening with possible pericholecystic edema. Abdominal ultrasound could be used to further evaluate as clinically warranted. 3. Mild  hepatoduodenal ligament lymphadenopathy. Critical Value/emergent results were called by me at the time of interpretation on 05/21/2015 at 9:12 pm to Dr. Phineas SemenGRAYDON GOODMAN , who verbally acknowledged these results. Electronically Signed   By: Kennith CenterEric  Mansell M.D.   On: 05/21/2015 21:12   [place] Venous Img Lower Bilateral  05/22/2015  ADDENDUM REPORT: 05/22/2015 12:37 ADDENDUM: These results were called by telephone at the time of interpretation on 05/22/2015 at 12:20 pm to Dr. Ramonita LabARUNA GOURU , who verbally acknowledged these results. Electronically Signed   By: Bary RichardStan  Maynard M.D.   On: 05/22/2015 12:37  05/22/2015  . IMPRESSION: Nonocclusive deep venous thrombosis within the RIGHT femoral vein and popliteal vein. No deep venous thrombosis identified within the left lower extremity. Electronically Signed: By: Bary RichardStan  Maynard M.D. On: 05/22/2015 12:09    EKG: SInus tachycardia, RBBB  Weights: Filed Weights   05/21/15 1704 05/22/15  0000  Weight: 225 lb (102.059 kg) 228 lb 2.8 oz (103.5 kg)     Physical Exam: Blood pressure 109/83, pulse 110, temperature 98.4 F (36.9 C), temperature source Oral, resp. rate 20, height  (1.727 m), weight 228 lb 2.8 oz (103.5 kg), SpO2 97 %. Body mass index is 34.7 kg/(m^2). General: Well developed, well nourished, in no acute distress. Head: Normocephalic, atraumatic, sclera non-icteric, no xanthomas, nares are without discharge.  Neck: Negative for carotid bruits. JVD not elevated. Lungs: Clear bilaterally to auscultation without wheezes, rales, or rhonchi. Breathing is unlabored. Heart: RRR with S1 S2. No murmurs, rubs, or gallops appreciated. Abdomen: Soft, non-tender, non-distended with normoactive bowel sounds. No hepatomegaly. No rebound/guarding. No obvious abdominal masses. Msk:  Strength and tone appear normal for age. Extremities: No clubbing or cyanosis. No edema.  Distal pedal pulses are 2+ and equal bilaterally. Neuro: Alert and oriented X 3. No facial  asymmetry. No focal deficit. Moves all extremities spontaneously. Psych:  Responds to questions appropriately with a normal affect.    Assessment and Plan:  49 year old Caucasian gentleman history of essential hypertension who is presenting with shortness of breath, found to have pulmonary embolism b/l  1) Pulmonary embolism with acute respiratory distress CT scan showing b/l PE, Large volume pulmonary embolus in both main pulmonary arteries, extending into lobar and segmental pulmonary arteries of both upper and lower lobes. Thrombus is occlusive in some segmental branches bilaterally.   Echo with moderately dilated RV, mild to moderate TR On my review RVSP is 50 mm mmHg + RA pressure of 20= 70+ mm Hg RV function is depressed, moderate in select views (parasternal long axis images)  He has had hypoxia at rest, worse with exertion with tachycardia, documented in the notes  This am after ambulating, developed hypotension, SBP to 60 - 70s, given IVF bolus, slowly improved back to 100 to 110 systolic.   U/S did show additional nonocclusive thrombus in the right femoral vein and popliteal area  Case discussed with Dr. Myra Gianotti, given his hemodynamic instability, continued hypoxia,  Elevated RVSP consistent with his large clot burden, additional thrombus in the right leg, and the fact there is nobody available to give thrombolytics at [facility] for a few days, the recommendation was made to transfer to [facility] for close monitoring and possibly thrombolytics for further hemodynamic instability.   ---COnsider starting heparin infusion tonight (no bolus) He received eliquis this AM  Addendum: Troponin this Am of <0.03, after hypotension epsiode Repeat echo this AM  (limited) shows RVSP at 60 mm Hg approx, RV appears to be functioning better, perhaps mildly depressed, less dilated on long axis images.  Improved compared to images obtained yesterday  Case discussed with Dr Amado Coe, Myra Gianotti and  Finnegan   2) Pulmonary HTN Likely secondary to acute PE Will need long term followup  3) Dilated RV/strain:  secondary to #1 above Troponin trending down, normal this AM  4) Hypotension Patient was having no sx even when SBP was 60 to 70 Unable to exclude cuff error, With patient supine, cuff repositioned by myself, SBP >100   Signed, Dossie Arbour, MD Ph.D [facility] 05/23/2015, 12:42 PM

## 2015-05-23 NOTE — Consult Note (Signed)
[facility]  Telephone:([phone] Fax:([phone]) [phone]  ID: Jermaine Blake OB: 1966/01/24  MR#: [id number]  VHQ#:[id number]  Patient Care Team: Danella Penton, MD as PCP - General (Internal Medicine)  CHIEF COMPLAINT:  Chief Complaint  Patient presents with  . Cough  . Shortness of Breath  . Irregular Heart Beat    INTERVAL HISTORY: Patient is a 49 year old male who presented to the emergency room with increasing shortness of breath over the past several weeks. He was found to have multiple bilateral pulmonary embolism as well as a large lower extremity DVT. Currently, patient states his symptoms are improved but he is not back to his baseline. He still having problems with tachycardia as well as hypotension. He denies any personal or family history of blood clotting. He denies any recent travel or episodes of immobility. He has no recent surgeries. He has no new medications. He has no neurologic complaints. He denies any recent fevers or illnesses. He has no nausea, vomiting, constipation, or diarrhea. He has no urinary complaints. Patient otherwise feels well and offers no further specific complaints.  REVIEW OF SYSTEMS:   Review of Systems  Constitutional: Negative for fever, weight loss and malaise/fatigue.  HENT: Negative.   Respiratory: Positive for shortness of breath. Negative for cough, hemoptysis and sputum production.   Cardiovascular: Negative for chest pain.  Gastrointestinal: Negative.   Musculoskeletal: Negative.   Neurological: Negative.  Negative for weakness.  Endo/Heme/Allergies: Does not bruise/bleed easily.    As per HPI. Otherwise, a complete review of systems is negatve.  PAST MEDICAL HISTORY: Past Medical History  Diagnosis Date  . CHF (congestive heart failure) (HCC)   . Hypertension     PAST SURGICAL HISTORY: History reviewed. No pertinent past surgical history.  FAMILY HISTORY Family History  Problem Relation Age of Onset    . Hypertension Other        ADVANCED DIRECTIVES:    HEALTH MAINTENANCE: Social History  Substance Use Topics  . Smoking status: Never Smoker   . Smokeless tobacco: None  . Alcohol Use: Yes     Comment: socially     Colonoscopy:  PAP:  Bone density:  Lipid panel:  Allergies  Allergen Reactions  . Penicillins Hives    Current Facility-Administered Medications  Medication Dose Route Frequency Provider Last Rate Last Dose  . antiseptic oral rinse (CPC / CETYLPYRIDINIUM CHLORIDE 0.05%) solution 7 mL  7 mL Mouth Rinse BID Wyatt Haste, MD   7 mL at 05/23/15 0902  . apixaban (ELIQUIS) tablet 10 mg  10 mg Oral BID Gardner Candle, RPH   10 mg at 05/23/15 0901   Followed by  . [START ON 05/29/2015] apixaban (ELIQUIS) tablet 5 mg  5 mg Oral BID Sheema M Hallaji, RPH      . aspirin EC tablet 81 mg  81 mg Oral Daily Wyatt Haste, MD   81 mg at 05/23/15 0901  . furosemide (LASIX) tablet 40 mg  40 mg Oral Daily Wyatt Haste, MD   40 mg at 05/23/15 0901  . ipratropium-albuterol (DUONEB) 0.5-2.5 (3) MG/3ML nebulizer solution 3 mL  3 mL Nebulization Q4H PRN Wyatt Haste, MD      . multivitamin with minerals tablet 1 tablet  1 tablet Oral Daily Wyatt Haste, MD   1 tablet at 05/23/15 0901  . pantoprazole (PROTONIX) EC tablet 40 mg  40 mg Oral Daily Wyatt Haste, MD   40 mg at 05/23/15 0901  .  ramipril (ALTACE) capsule 10 mg  10 mg Oral Daily Wyatt Haste, MD   10 mg at 05/23/15 0901  . sodium chloride 0.9 % injection 3 mL  3 mL Intravenous Q12H Wyatt Haste, MD   3 mL at 05/23/15 0902  . verapamil (CALAN-SR) CR tablet 240 mg  240 mg Oral Daily Wyatt Haste, MD   240 mg at 05/23/15 0902    OBJECTIVE: Filed Vitals:   05/23/15 1245 05/23/15 1300  BP: 111/94 121/84  Pulse: 112 107  Temp:    Resp: 22 16     Body mass index is 34.7 kg/(m^2).    ECOG FS:0 - Asymptomatic  General: Well-developed, well-nourished, no acute distress. Eyes: Pink conjunctiva, anicteric sclera. HEENT:  Normocephalic, moist mucous membranes, clear oropharnyx. Lungs: Clear to auscultation bilaterally. Heart: Regular rate and rhythm. No rubs, murmurs, or gallops. Abdomen: Soft, nontender, nondistended. No organomegaly noted, normoactive bowel sounds. Musculoskeletal: No edema, cyanosis, or clubbing. Neuro: Alert, answering all questions appropriately. Cranial nerves grossly intact. Skin: No rashes or petechiae noted. Psych: Normal affect. Lymphatics: No cervical, calvicular, axillary or inguinal LAD.   LAB RESULTS:  Lab Results  Component Value Date   NA 140 05/23/2015   K 3.6 05/23/2015   CL 108 05/23/2015   CO2 23 05/23/2015   GLUCOSE 109* 05/23/2015   BUN 12 05/23/2015   CREATININE 1.10 05/23/2015   CALCIUM 9.0 05/23/2015   GFRNONAA >60 05/23/2015   GFRAA >60 05/23/2015    Lab Results  Component Value Date   WBC 8.5 05/23/2015   NEUTROABS 9.2* 05/21/2015   HGB 15.2 05/23/2015   HCT 45.8 05/23/2015   MCV 93.1 05/23/2015   PLT 115* 05/23/2015     STUDIES: Dg Chest 2 View  05/21/2015  CLINICAL DATA:  Shortness of breath EXAM: CHEST  2 VIEW COMPARISON:  07/13/2006 chest radiograph. FINDINGS: Stable cardiomediastinal silhouette with top-normal heart size. No pneumothorax. No pleural effusion. Clear lungs, with no focal lung consolidation and no pulmonary edema. IMPRESSION: No active cardiopulmonary disease. Electronically Signed   By: Delbert Phenix M.D.   On: 05/21/2015 17:42   Ct Angio Chest Pe W/cm &/or Wo Cm  05/21/2015  CLINICAL DATA:  Initial encounter for cough and difficulty breathing starting 3 days ago but worsening today. EXAM: CT ANGIOGRAPHY CHEST WITH CONTRAST TECHNIQUE: Multidetector CT imaging of the chest was performed using the standard protocol during bolus administration of intravenous contrast. Multiplanar CT image reconstructions and MIPs were obtained to evaluate the vascular anatomy. CONTRAST:  OMNIPAQUE IOHEXOL 350 MG/ML SOLN COMPARISON:  None.  FINDINGS: Mediastinum / Lymph Nodes: There is no axillary lymphadenopathy. No mediastinal lymphadenopathy. There is no hilar lymphadenopathy. Heart size is normal. No pericardial effusion. The esophagus has normal imaging features. No thoracic aortic aneurysm. No dissection of the thoracic aorta. Large volume pulmonary embolus is evident in both main pulmonary arteries, extending into lobar and segmental pulmonary arteries of both upper and lower lobes. Thrombus is occlusive in some segmental branches bilaterally. There is paradoxical inversion of the interventricular septum and the RV / LV ratio is 1.9. Lungs / Pleura: Lung windows show no focal airspace consolidation. There is some atelectasis in the lower lungs bilaterally, left greater than right. No definite pulmonary infarct at this time. No pulmonary edema or pleural effusion. Upper Abdomen: Imaging through the upper abdomen suggests mild gallbladder wall thickening with potentially a small amount of pericholecystic fluid. Mild lymphadenopathy is associated in the hepatoduodenal ligament. MSK / Soft  Tissues: Bone windows reveal no worrisome lytic or sclerotic osseous lesions. Review of the MIP images confirms the above findings. IMPRESSION: 1. Positive for acute PE with CTevidence of right heart strain (RV/LV Ratio = 1.9) consistent with at least submassive (intermediate risk) PE. The presence of right heart strain has been associated with an increased risk of morbidity and mortality. 2. Question mild gallbladder wall thickening with possible pericholecystic edema. Abdominal ultrasound could be used to further evaluate as clinically warranted. 3. Mild hepatoduodenal ligament lymphadenopathy. Critical Value/emergent results were called by me at the time of interpretation on 05/21/2015 at 9:12 pm to Dr. Phineas Semen , who verbally acknowledged these results. Electronically Signed   By: Kennith Center M.D.   On: 05/21/2015 21:12   US Venous Img Lower  Bilateral  05/22/2015  ADDENDUM REPORT: 05/22/2015 12:37 ADDENDUM: These results were called by telephone at the time of interpretation on 05/22/2015 at 12:20 pm to Dr. Ramonita Lab , who verbally acknowledged these results. Electronically Signed   By: Bary Richard M.D.   On: 05/22/2015 12:37  05/22/2015  CLINICAL DATA:  Pulmonary emboli. EXAM: BILATERAL LOWER EXTREMITY VENOUS DOPPLER ULTRASOUND TECHNIQUE: Gray-scale sonography with graded compression, as well as color Doppler and duplex ultrasound were performed to evaluate the lower extremity deep venous systems from the level of the common femoral vein and including the common femoral, femoral, profunda femoral, popliteal and calf veins including the posterior tibial, peroneal and gastrocnemius veins when visible. The superficial great saphenous vein was also interrogated. Spectral Doppler was utilized to evaluate flow at rest and with distal augmentation maneuvers in the common femoral, femoral and popliteal veins. COMPARISON:  None. FINDINGS: RIGHT LOWER EXTREMITY Common Femoral Vein: No evidence of thrombus. Normal compressibility, respiratory phasicity and response to augmentation. Saphenofemoral Junction: No evidence of thrombus. Normal compressibility and flow on color Doppler imaging. Profunda Femoral Vein: No evidence of thrombus. Normal compressibility and flow on color Doppler imaging. Femoral Vein: Nonocclusive thrombus within the proximal right superficial femoral vein. Popliteal Vein: Nonocclusive thrombus throughout the right popliteal vein. Calf Veins: No evidence of thrombus. Normal compressibility and flow on color Doppler imaging. Superficial Great Saphenous Vein: No evidence of thrombus. Normal compressibility and flow on color Doppler imaging. Venous Reflux:  None. Other Findings:  None. LEFT LOWER EXTREMITY Common Femoral Vein: No evidence of thrombus. Normal compressibility, respiratory phasicity and response to augmentation.  Saphenofemoral Junction: No evidence of thrombus. Normal compressibility and flow on color Doppler imaging. Profunda Femoral Vein: No evidence of thrombus. Normal compressibility and flow on color Doppler imaging. Femoral Vein: No evidence of thrombus. Normal compressibility, respiratory phasicity and response to augmentation. Popliteal Vein: No evidence of thrombus. Normal compressibility, respiratory phasicity and response to augmentation. Calf Veins: No evidence of thrombus. Normal compressibility and flow on color Doppler imaging. Superficial Great Saphenous Vein: No evidence of thrombus. Normal compressibility and flow on color Doppler imaging. Venous Reflux:  None. Other Findings:  None. IMPRESSION: Nonocclusive deep venous thrombosis within the RIGHT femoral vein and popliteal vein. No deep venous thrombosis identified within the left lower extremity. Electronically Signed: By: Bary Richard M.D. On: 05/22/2015 12:09    ASSESSMENT: Extensive PE and DVT.  PLAN:    1. PE/DVT: CT scan and ultrasound results reviewed independently and reported as above. Patient has no obvious transient risk factors. He will require a minimum of 6 months of anticoagulation. Full hypercoagulable workup can be completed as an outpatient once patient is stable. Case was discussed at length  with Dr. Mariah MillingGollan with cardiology and Dr. Myra GianottiBrabham with vascular surgery. Consideration is being made for patient to be transferred in order to have thrombolysis of his extensive clot burden. Continue heparin drip. Patient did receive Eliquis yesterday and today, but this will be placed on hold in case any invasive interventions are required. Patient has no obvious malignancy, but given the edema around his gallbladder can consider CT of the abdomen and pelvis for further evaluation. Once patient is stable and discharged from the hospital, he can [facility] in 2-3 weeks to complete his hypercoagulable workup and any other  diagnostic testing necessary. Appreciate cardiology and vascular surgery input. 2. Thromocytopenia: Mild, monitor.  Appreciate consult, will follow.   Jeralyn Ruthsimothy J Finnegan, MD   05/23/2015 1:40 PM

## 2015-05-23 NOTE — Progress Notes (Signed)
*  PRELIMINARY RESULTS* Echocardiogram 2D Echocardiogram has been performed. This was a Limited Follow-up Echo requested by Dr. Mariah MillingGollan to re-evaluate the Right Heart.  Jermaine Blake 05/23/2015, 1:15 PM

## 2015-05-23 NOTE — Progress Notes (Signed)
Patients heart rate was in the 100's prior to ambulation, sitting on side of bed his heart rate was up to 120. Ambulated patient around unit and he denied any shortness of breath. His oxygen saturation dropped down to 85% on room air, heart rate was up to 130 with ambulation. Patient placed back on 2 liters nasal cannula, he is now sitting up in the chair and heart rate is 109 with frequent PVC's. Notified Dr. Amado CoeGouru and will continue to assess and monitor.

## 2015-05-23 NOTE — Discharge Summary (Signed)
[facility] [place] at [facility]   PATIENT NAME: Jermaine Blake    MR#:  [id number]  DATE OF BIRTH:  10-27-1965  DATE OF ADMISSION:  05/21/2015 ADMITTING PHYSICIAN: Wyatt Haste, MD  DATE OF DISCHARGE: 05/23/2015 PRIMARY CARE PHYSICIAN: Danella Penton., MD    ADMISSION DIAGNOSIS:  Other acute pulmonary embolism (HCC) [I26.99]  DISCHARGE DIAGNOSIS:  Principal Problem:   Pulmonary embolism (HCC)  right lower extremity DVT SECONDARY DIAGNOSIS:   Past Medical History  Diagnosis Date  . CHF (congestive heart failure) (HCC)   . Hypertension     HOSPITAL COURSE:   49 year old Caucasian gentleman history of essential hypertension who is presenting with shortness of breath, found to have pulmonary embolism  1 acute pulmonary embolus  from right femoral and popliteal DVT Patient will be transferred to [facility] for possible thrombolysis in view of hemodynamic instability-patient is getting short of breath, tachycardic and tachypneic with minimal exertion Patient has received Eliqusis  a.m. dose today. Discontinued further Eliquis doses and holding aspirin for possible thrombolysis at [facility] hospital as per cardio and vascular surgery recommendations Appreciate vascular surgery, cardiology,  and pulmonology recommendations There was evidence of right heart strain suggested by CT . Echocardiogram did not reveal any right heart strain Pt did not qualify for any advanced interventional procedure or TPA as he was hemodynamically stable at the time of admission. He has been placed on heparin drip at the time of admission which was changed to NOAC -Eliquis from yesterday Troponin is elevated initially but trending down  Echo from 05/22/2015 with ejection fraction 55-60%. Repeat echocardiogram is done today result is pending    2. Essential hypertension: Continue home medications but holding verapamil as patient became hypotensive today  hold other  medications as needed  3. GERD without esophagitis PPI therapy  4. Venous thromboembolism prophylactic: Patient has received Eliquis in a.m. today  DISCHARGE CONDITIONS:   Guarded  CONSULTS OBTAINED:  Treatment Team:  Wyatt Haste, MD Jeralyn Ruths, MD Antonieta Iba, MD   PROCEDURES none DRUG ALLERGIES:   Allergies  Allergen Reactions  . Penicillins Hives    DISCHARGE MEDICATIONS:   Current Discharge Medication List    CONTINUE these medications which have NOT CHANGED   Details  furosemide (LASIX) 40 MG tablet Take 1 tablet by mouth daily.    gabapentin (NEURONTIN) 300 MG capsule Take 1 capsule by mouth daily.    Multiple Vitamin (MULTIVITAMIN WITH MINERALS) TABS tablet Take 1 tablet by mouth daily.    pantoprazole (PROTONIX) 40 MG tablet Take 1 tablet by mouth daily.    ramipril (ALTACE) 10 MG capsule Take 1 capsule by mouth daily.      STOP taking these medications     aspirin EC 81 MG tablet      diclofenac (VOLTAREN) 75 MG EC tablet      ibuprofen (ADVIL,MOTRIN) 200 MG tablet      verapamil (VERELAN PM) 240 MG 24 hr capsule          DISCHARGE INSTRUCTIONS:  Patient is getting transferred to [facility] intensive care unit   DIET:  Cardiac diet  DISCHARGE CONDITION:  gaurded  ACTIVITY:  Bedrest  OXYGEN:  Home Oxygen: Yes.     Oxygen Delivery: 2 liters/min via Patient connected to nasal cannula oxygen  DISCHARGE LOCATION:  [facility] intensive care unit   If you experience worsening of your admission symptoms, develop shortness of breath, life threatening emergency, suicidal or homicidal  thoughts you must seek medical attention immediately by calling 911 or calling your MD immediately  if symptoms less severe.  You Must read complete instructions/literature along with all the possible adverse reactions/side effects for all the Medicines you take and that have been prescribed to you. Take any new Medicines after you have  completely understood and accpet all the possible adverse reactions/side effects.   Please note  You were cared for by a hospitalist during your hospital stay. If you have any questions about your discharge medications or the care you received while you were in the hospital after you are discharged, you can call the unit and asked to speak with the hospitalist on call if the hospitalist that took care of you is not available. Once you are discharged, your primary care physician will handle any further medical issues. Please note that NO REFILLS for any discharge medications will be authorized once you are discharged, as it is imperative that you return to your primary care physician (or establish a relationship with a primary care physician if you do not have one) for your aftercare needs so that they can reassess your need for medications and monitor your lab values.     Today  Chief Complaint  Patient presents with  . Cough  . Shortness of Breath  . Irregular Heart Beat   Patient ambulated in the hallway and became hypotensive with systolic blood pressure at around 60, tachycardic with heart rate around 130. Patient was given fluid bolus following which her blood pressure and heart rate improved. Patient denies any chest pain or shortness of breath. Patient denies any dizziness.Discussed with vascular surgery, cardiology and pulmonology, as patient is hemodynamically unstable, they have recommended to transfer the patient to [facility] intensive care unit for thrombolysis. Patient and his sister are agreeable with the plan  ROS:  CONSTITUTIONAL: Denies fevers, chills. Denies any fatigue, weakness.  EYES: Denies blurry vision, double vision, eye pain. EARS, NOSE, THROAT: Denies tinnitus, ear pain, hearing loss. RESPIRATORY: Denies cough, wheeze, shortness of breath.  CARDIOVASCULAR: Denies chest pain, palpitations, edema.  GASTROINTESTINAL: Denies nausea, vomiting, diarrhea, abdominal  pain. Denies bright red blood per rectum. GENITOURINARY: Denies dysuria, hematuria. ENDOCRINE: Denies nocturia or thyroid problems. HEMATOLOGIC AND LYMPHATIC: Denies easy bruising or bleeding. SKIN: Denies rash or lesion. MUSCULOSKELETAL: Denies pain in neck, back, shoulder, knees, hips or arthritic symptoms.  NEUROLOGIC: Denies paralysis, paresthesias.  PSYCHIATRIC: Denies anxiety or depressive symptoms.   VITAL SIGNS:  Blood pressure 121/84, pulse 107, temperature 98.4 F (36.9 C), temperature source Oral, resp. rate 16, height 5\' 8"  (1.727 m), weight 103.5 kg (228 lb 2.8 oz), SpO2 97 %.  I/O:    Intake/Output Summary (Last 24 hours) at 05/23/15 1342 Last data filed at 05/23/15 1254  Gross per 24 hour  Intake    920 ml  Output   1425 ml  Net   -505 ml    PHYSICAL EXAMINATION:  GENERAL:  49 y.o.-year-old patient lying in the bed with no acute distress.  EYES: Pupils equal, round, reactive to light and accommodation. No scleral icterus. Extraocular muscles intact.  HEENT: Head atraumatic, normocephalic. Oropharynx and nasopharynx clear.  NECK:  Supple, no jugular venous distention. No thyroid enlargement, no tenderness.  LUNGS: Normal breath sounds bilaterally, no wheezing, rales,rhonchi or crepitation. No use of accessory muscles of respiration.  CARDIOVASCULAR: S1, S2 normal. No murmurs, rubs, or gallops.  ABDOMEN: Soft, non-tender, non-distended. Bowel sounds present. No organomegaly or mass.  EXTREMITIES: No pedal  edema, cyanosis, or clubbing.  NEUROLOGIC: Cranial nerves II through XII are intact. Muscle strength 5/5 in all extremities. Sensation intact. Gait not checked.  PSYCHIATRIC: The patient is alert and oriented x 3.  SKIN: No obvious rash, lesion, or ulcer.   DATA REVIEW:   CBC  Recent Labs Lab 05/23/15 0647  WBC 8.5  HGB 15.2  HCT 45.8  PLT 115*    Chemistries   Recent Labs Lab 05/22/15 0002  05/23/15 0647  NA  --   < > 140  K  --   < > 3.6  CL   --   < > 108  CO2  --   < > 23  GLUCOSE  --   < > 109*  BUN  --   < > 12  CREATININE  --   < > 1.10  CALCIUM  --   < > 9.0  MG 1.9  --   --   < > = values in this interval not displayed.  Cardiac Enzymes  Recent Labs Lab 05/23/15 1233  TROPONINI <0.03    Microbiology Results  Results for orders placed or performed during the hospital encounter of 05/21/15  MRSA PCR Screening     Status: None   Collection Time: 05/21/15 11:45 PM  Result Value Ref Range Status   MRSA by PCR NEGATIVE NEGATIVE Final    Comment:        The GeneXpert MRSA Assay (FDA approved for NASAL specimens only), is one component of a comprehensive MRSA colonization surveillance program. It is not intended to diagnose MRSA infection nor to guide or monitor treatment for MRSA infections.     RADIOLOGY:  Dg Chest 2 View  05/21/2015  CLINICAL DATA:  Shortness of breath EXAM: CHEST  2 VIEW COMPARISON:  07/13/2006 chest radiograph. FINDINGS: Stable cardiomediastinal silhouette with top-normal heart size. No pneumothorax. No pleural effusion. Clear lungs, with no focal lung consolidation and no pulmonary edema. IMPRESSION: No active cardiopulmonary disease. Electronically Signed   By: Delbert PhenixJason A Poff M.D.   On: 05/21/2015 17:42   Ct Angio Chest Pe W/cm &/or Wo Cm  05/21/2015  CLINICAL DATA:  Initial encounter for cough and difficulty breathing starting 3 days ago but worsening today. EXAM: CT ANGIOGRAPHY CHEST WITH CONTRAST TECHNIQUE: Multidetector CT imaging of the chest was performed using the standard protocol during bolus administration of intravenous contrast. Multiplanar CT image reconstructions and MIPs were obtained to evaluate the vascular anatomy. CONTRAST:  100mL OMNIPAQUE IOHEXOL 350 MG/ML SOLN COMPARISON:  None. FINDINGS: Mediastinum / Lymph Nodes: There is no axillary lymphadenopathy. No mediastinal lymphadenopathy. There is no hilar lymphadenopathy. Heart size is normal. No pericardial effusion. The  esophagus has normal imaging features. No thoracic aortic aneurysm. No dissection of the thoracic aorta. Large volume pulmonary embolus is evident in both main pulmonary arteries, extending into lobar and segmental pulmonary arteries of both upper and lower lobes. Thrombus is occlusive in some segmental branches bilaterally. There is paradoxical inversion of the interventricular septum and the RV / LV ratio is 1.9. Lungs / Pleura: Lung windows show no focal airspace consolidation. There is some atelectasis in the lower lungs bilaterally, left greater than right. No definite pulmonary infarct at this time. No pulmonary edema or pleural effusion. Upper Abdomen: Imaging through the upper abdomen suggests mild gallbladder wall thickening with potentially a small amount of pericholecystic fluid. Mild lymphadenopathy is associated in the hepatoduodenal ligament. MSK / Soft Tissues: Bone windows reveal no worrisome lytic or sclerotic osseous  lesions. Review of the MIP images confirms the above findings. IMPRESSION: 1. Positive for acute PE with CTevidence of right heart strain (RV/LV Ratio = 1.9) consistent with at least submassive (intermediate risk) PE. The presence of right heart strain has been associated with an increased risk of morbidity and mortality. 2. Question mild gallbladder wall thickening with possible pericholecystic edema. Abdominal ultrasound could be used to further evaluate as clinically warranted. 3. Mild hepatoduodenal ligament lymphadenopathy. Critical Value/emergent results were called by me at the time of interpretation on 05/21/2015 at 9:12 pm to Dr. Phineas Semen , who verbally acknowledged these results. Electronically Signed   By: Kennith Center M.D.   On: 05/21/2015 21:12   US Venous Img Lower Bilateral  05/22/2015  ADDENDUM REPORT: 05/22/2015 12:37 ADDENDUM: These results were called by telephone at the time of interpretation on 05/22/2015 at 12:20 pm to Dr. Ramonita Lab , who verbally  acknowledged these results. Electronically Signed   By: Bary Richard M.D.   On: 05/22/2015 12:37  05/22/2015  CLINICAL DATA:  Pulmonary emboli. EXAM: BILATERAL LOWER EXTREMITY VENOUS DOPPLER ULTRASOUND TECHNIQUE: Gray-scale sonography with graded compression, as well as color Doppler and duplex ultrasound were performed to evaluate the lower extremity deep venous systems from the level of the common femoral vein and including the common femoral, femoral, profunda femoral, popliteal and calf veins including the posterior tibial, peroneal and gastrocnemius veins when visible. The superficial great saphenous vein was also interrogated. Spectral Doppler was utilized to evaluate flow at rest and with distal augmentation maneuvers in the common femoral, femoral and popliteal veins. COMPARISON:  None. FINDINGS: RIGHT LOWER EXTREMITY Common Femoral Vein: No evidence of thrombus. Normal compressibility, respiratory phasicity and response to augmentation. Saphenofemoral Junction: No evidence of thrombus. Normal compressibility and flow on color Doppler imaging. Profunda Femoral Vein: No evidence of thrombus. Normal compressibility and flow on color Doppler imaging. Femoral Vein: Nonocclusive thrombus within the proximal right superficial femoral vein. Popliteal Vein: Nonocclusive thrombus throughout the right popliteal vein. Calf Veins: No evidence of thrombus. Normal compressibility and flow on color Doppler imaging. Superficial Great Saphenous Vein: No evidence of thrombus. Normal compressibility and flow on color Doppler imaging. Venous Reflux:  None. Other Findings:  None. LEFT LOWER EXTREMITY Common Femoral Vein: No evidence of thrombus. Normal compressibility, respiratory phasicity and response to augmentation. Saphenofemoral Junction: No evidence of thrombus. Normal compressibility and flow on color Doppler imaging. Profunda Femoral Vein: No evidence of thrombus. Normal compressibility and flow on color Doppler  imaging. Femoral Vein: No evidence of thrombus. Normal compressibility, respiratory phasicity and response to augmentation. Popliteal Vein: No evidence of thrombus. Normal compressibility, respiratory phasicity and response to augmentation. Calf Veins: No evidence of thrombus. Normal compressibility and flow on color Doppler imaging. Superficial Great Saphenous Vein: No evidence of thrombus. Normal compressibility and flow on color Doppler imaging. Venous Reflux:  None. Other Findings:  None. IMPRESSION: Nonocclusive deep venous thrombosis within the RIGHT femoral vein and popliteal vein. No deep venous thrombosis identified within the left lower extremity. Electronically Signed: By: Bary Richard M.D. On: 05/22/2015 12:09    EKG:   Orders placed or performed during the hospital encounter of 05/21/15  . EKG 12-Lead  . EKG 12-Lead  . EKG 12-Lead  . EKG 12-Lead      Management plans discussed with the patient, family and they are in agreement.  CODE STATUS:     Code Status Orders        Start  Ordered   05/21/15 2213  Full code   Continuous     05/21/15 2212      TOTAL  Critical TIME TAKING CARE OF THIS PATIENT: 45  minutes.    @  on 05/23/2015 at 1:42 PM  Between 7am to 6pm - Pager - [phone]  After 6pm go to [email or web address] - password [email or web address] [facility]  [facility]  Office  [phone]  CC: Primary care physician; Danella Penton., MD

## 2015-05-23 NOTE — Progress Notes (Signed)
[facility] to [facility] Update given on patient and second bolus of fluids complete. Dr. Belia HemanKasa aware that [facility]. Patients sister also present. No other changes noted.

## 2015-05-23 NOTE — H&P (Signed)
PULMONARY / CRITICAL CARE MEDICINE   Name: Jermaine Blake Laser MRN: [id number] DOB: [birth year]    ADMISSION DATE:  05/23/2015  [facility]  CHIEF COMPLAINT:  Shortness of breath  HISTORY OF PRESENT ILLNESS:   49 yo male [facility] with progressive dyspnea for 3 days prior to admission.  He had cough and one episode of hemoptysis as well as pleuritic type chest pain.  He was found to have submassive PE and Rt leg DVT.  His oxygenation and hemodynamics were stable.  He had initial evaluation by critical care, and was not a candidate for thrombolytic therapy.  He was started on eliquis.  [facility].  Plan was for d/c home on 12/31.  He was given his blood pressure medications, and then ambulated in hall.  When he returned to his hospital room he was noted to be hypotensive.  He was given IV fluid and blood pressure improved.  He was evaluated by cardiology and vascular surgery.  There was concern his hemodynamic instability could have been related to recurrent PE and that he might need thrombolytic therapy.  [facility].  He currently denies headache, dizziness, chest pain, nausea, dyspnea, abdominal pain, or leg swelling.  There is no obvious cause for his thromboembolic disease.  PAST MEDICAL HISTORY :  He  has a past medical history of Hypertension.  PAST SURGICAL HISTORY: He  has no past surgical history on file.  Allergies  Allergen Reactions  . Penicillins Hives    No current facility-administered medications on file prior to encounter.   Current Outpatient Prescriptions on File Prior to Encounter  Medication Sig  . furosemide (LASIX) 40 MG tablet Take 1 tablet by mouth daily.  Marland Kitchen. gabapentin (NEURONTIN) 300 MG capsule Take 1 capsule by mouth daily.  . Multiple Vitamin (MULTIVITAMIN WITH MINERALS) TABS tablet Take 1 tablet by mouth daily.  . pantoprazole (PROTONIX) 40 MG tablet Take 1 tablet by mouth daily.  . ramipril  (ALTACE) 10 MG capsule Take 1 capsule by mouth daily.    FAMILY HISTORY:  His is negative for clotting disorder.  SOCIAL HISTORY: He  reports that he has never smoked. He does not have any smokeless tobacco history on file. He reports that he drinks alcohol. He reports that he does not use illicit drugs.  REVIEW OF SYSTEMS:   Negative except above.  SUBJECTIVE:  Denies feeling dizzy.  VITAL SIGNS: There were no vitals taken for this visit. BP 107/88, HR 96, RR 19, SpO2 96%, T 98.4  INTAKE / OUTPUT:    PHYSICAL EXAMINATION: General: pleasant Neuro:  Alert, normal strength, CN intact HEENT:  Pupils reactive, no LAN Cardiovascular:  Regular, no murmur Lungs:  No wheeze/rales Abdomen:  Soft, non tender Musculoskeletal:  No edema Skin:  No rashes  LABS:  BMET  Recent Labs Lab 05/21/15 1659 05/22/15 0437 05/23/15 0647  NA 138 139 140  K 3.7 4.1 3.6  CL 104 108 108  CO2 24 22 23   BUN 10 10 12   CREATININE 1.08 1.12 1.10  GLUCOSE 144* 116* 109*    Electrolytes  Recent Labs Lab 05/21/15 1659 05/22/15 0002 05/22/15 0437 05/23/15 0647  CALCIUM 9.1  --  8.7* 9.0  MG  --  1.9  --   --     CBC  Recent Labs Lab 05/21/15 1659 05/22/15 0437 05/23/15 0647  WBC 13.4* 10.2 8.5  HGB 18.5* 15.5 15.2  HCT 55.5*  46.2 45.8  PLT 139* 99* 115*    Coag's  Recent Labs Lab 05/21/15 1659  APTT 26  INR 1.21    Sepsis Markers No results for input(s): LATICACIDVEN, PROCALCITON, O2SATVEN in the last 168 hours.  ABG No results for input(s): PHART, PCO2ART, PO2ART in the last 168 hours.  Liver Enzymes No results for input(s): AST, ALT, ALKPHOS, BILITOT, ALBUMIN in the last 168 hours.  Cardiac Enzymes  Recent Labs Lab 05/22/15 0437 05/22/15 1239 05/23/15 1233  TROPONINI 0.14* <0.03 <0.03    Glucose  Recent Labs Lab 05/21/15 2335  GLUCAP 129*    Imaging No results found.   STUDIES:  12/29 CT chest >> PE with RV:LV ratio 1.9 12/30 Echo >> EF  55 to 60%, mod RV dilation 12/30 Doppler legs b/Blake >> DVT Rt femoral vein and popliteal vein 12/31 Echo >>  CULTURES:  ANTIBIOTICS:  SIGNIFICANT EVENTS: 12/29 Admit to [facility] 12/31 Transfer to [facility]  LINES/TUBES:  DISCUSSION: 49 yo male unprovoked PE and DVT.  Transferred to [facility] to assess for EKOS.  His low BP on 12/31 might have been related to hypovolemia and resumption of his outpt blood pressure medications.  ASSESSMENT / PLAN:  Pulmonary embolism with Rt leg DVT. Plan: - hold eliquis for now - heparin gtt per pharmacy - monitor hemodynamics and then assess whether he should be candidate for EKOS - f/u repeat Echo from 12/31  Hypotension likely from hypovolemia and medications in setting of PE. Hx of HTN. Plan: - LR at 50 ml/hr - hold lasix, calan, ramapril  Acute hypoxic respiratory failure 2nd to PE. Plan: - oxygen therapy to keep SpO2 > 92%   Coralyn Helling, MD [facility] Pulmonary/Critical Care 05/23/2015, 4:44 PM Pager:  [phone] After 3pm call: [phone]

## 2015-05-24 DIAGNOSIS — I2602 Saddle embolus of pulmonary artery with acute cor pulmonale: Secondary | ICD-10-CM

## 2015-05-24 LAB — COMPREHENSIVE METABOLIC PANEL
ALBUMIN: 2.9 g/dL — AB (ref 3.5–5.0)
ALT: 14 U/L — ABNORMAL LOW (ref 17–63)
ANION GAP: 10 (ref 5–15)
AST: 17 U/L (ref 15–41)
Alkaline Phosphatase: 53 U/L (ref 38–126)
BUN: 11 mg/dL (ref 6–20)
CHLORIDE: 105 mmol/L (ref 101–111)
CO2: 23 mmol/L (ref 22–32)
Calcium: 8.8 mg/dL — ABNORMAL LOW (ref 8.9–10.3)
Creatinine, Ser: 1.08 mg/dL (ref 0.61–1.24)
GFR calc Af Amer: >60 mL/min (ref >60)
GFR calc non Af Amer: >60 mL/min (ref >60)
GLUCOSE: 93 mg/dL (ref 65–99)
POTASSIUM: 3.6 mmol/L (ref 3.5–5.1)
SODIUM: 138 mmol/L (ref 135–145)
Total Bilirubin: 1.4 mg/dL — ABNORMAL HIGH (ref 0.3–1.2)
Total Protein: 6.6 g/dL (ref 6.5–8.1)

## 2015-05-24 LAB — CBC
HEMATOCRIT: 42.7 % (ref 39.0–52.0)
HEMOGLOBIN: 14.3 g/dL (ref 13.0–17.0)
MCH: 31.7 pg (ref 26.0–34.0)
MCHC: 33.5 g/dL (ref 30.0–36.0)
MCV: 94.7 fL (ref 78.0–100.0)
Platelets: 122 10*3/uL — ABNORMAL LOW (ref 150–400)
RBC: 4.51 MIL/uL (ref 4.22–5.81)
RDW: 14.5 % (ref 11.5–15.5)
WBC: 7.1 10*3/uL (ref 4.0–10.5)

## 2015-05-24 LAB — APTT: aPTT: 70 seconds — ABNORMAL HIGH (ref 24–37)

## 2015-05-24 LAB — HEPARIN LEVEL (UNFRACTIONATED)

## 2015-05-24 MED ORDER — APIXABAN 5 MG PO TABS: 5.0000 mg | ORAL_TABLET | Freq: Two times a day (BID) | ORAL | Status: DC

## 2015-05-24 MED ORDER — APIXABAN 5 MG PO TABS
10.0000 mg | ORAL_TABLET | Freq: Two times a day (BID) | ORAL | Status: DC
  Administered 2015-05-24 – 2015-05-25 (×3): 10 mg via ORAL
  Filled 2015-05-24 (×5): qty 2

## 2015-05-24 NOTE — Discharge Instructions (Addendum)
Information on my medicine - ELIQUIS (apixaban)  This medication education was reviewed with me or my healthcare representative as part of my discharge preparation.    Why was Eliquis prescribed for you? Eliquis was prescribed to treat blood clots that may have been found in the veins of your legs (deep vein thrombosis) or in your lungs (pulmonary embolism) and to reduce the risk of them occurring again.  What do You need to know about Eliquis ? The starting dose is 10 mg (two 5 mg tablets) taken TWICE daily for the FIRST SEVEN (7) DAYS, then on 05/29/2015 the dose is reduced to ONE 5 mg tablet taken TWICE daily.  Eliquis may be taken with or without food.   Try to take the dose about the same time in the morning and in the evening. If you have difficulty swallowing the tablet whole please discuss with your pharmacist how to take the medication safely.  Take Eliquis exactly as prescribed and DO NOT stop taking Eliquis without talking to the doctor who prescribed the medication.  Stopping may increase your risk of developing a new blood clot.  Refill your prescription before you run out.  After discharge, you should have regular check-up appointments with your healthcare provider that is prescribing your Eliquis.    What do you do if you miss a dose? If a dose of ELIQUIS is not taken at the scheduled time, take it as soon as possible on the same day and twice-daily administration should be resumed. The dose should not be doubled to make up for a missed dose.  Important Safety Information A possible side effect of Eliquis is bleeding. You should call your healthcare provider right away if you experience any of the following: ? Bleeding from an injury or your nose that does not stop. ? Unusual colored urine (red or dark brown) or unusual colored stools (red or black). ? Unusual bruising for unknown reasons. ? A serious fall or if you hit your head (even if there is no bleeding).  Some  medicines may interact with Eliquis and might increase your risk of bleeding or clotting while on Eliquis. To help avoid this, consult your healthcare provider or pharmacist prior to using any new prescription or non-prescription medications, including herbals, vitamins, non-steroidal anti-inflammatory drugs (NSAIDs) and supplements.  This website has more information on Eliquis (apixaban): http://www.eliquis.com/eliquis/home   1.  Review medications carefully as they have changed.  2.  Call for follow up with your primary care provider regarding your blood pressure 3.  Call for follow up with Hematology as previously discussed / arranged.  4.  You are now on a blood thinner - please be cautious with shaving, kitchen prep etc.  Report to ER immediately if you have bleeding that you can not control.

## 2015-05-24 NOTE — Plan of Care (Signed)
Problem: Activity: Goal: Risk for activity intolerance will decrease Outcome: Progressing Pt ambulated in hallway, no complaints of SOB. Ambulatory in room. Steady, no assistive devices needed. Sister at bedside.

## 2015-05-24 NOTE — Progress Notes (Signed)
ANTICOAGULATION CONSULT NOTE - Follow Up Consult  Pharmacy Consult for Heparin  Indication: pulmonary embolus  Allergies  Allergen Reactions  . Penicillins Hives    Has patient had a PCN reaction causing immediate rash, facial/tongue/throat swelling, SOB or lightheadedness with hypotension: Yes Has patient had a PCN reaction causing severe rash involving mucus membranes or skin necrosis: No Has patient had a PCN reaction that required hospitalization No Has patient had a PCN reaction occurring within the last 10 years: No If all of the above answers are "NO", then may proceed with Cephalosporin use.    Patient Measurements: 103 kg  Vital Signs: Temp: 97.2 F (36.2 C) (01/01 0347) Temp Source: Oral (01/01 0347) BP: 108/53 mmHg (01/01 0500) Pulse Rate: 77 (01/01 0500)  Labs:  Recent Labs  05/21/15 1659  05/22/15 0437 05/22/15 1239 05/23/15 0647 05/23/15 1233 05/24/15 0317  HGB 18.5*  --  15.5  --  15.2  --  14.3  HCT 55.5*  --  46.2  --  45.8  --  42.7  PLT 139*  --  99*  --  115*  --  122*  APTT 26  --   --   --   --   --  70*  LABPROT 15.5*  --   --   --   --   --   --   INR 1.21  --   --   --   --   --   --   HEPARINUNFRC  --   --  0.28*  --   --   --  >2.20*  CREATININE 1.08  --  1.12  --  1.10  --  1.08  TROPONINI 0.05*  < > 0.14* <0.03  --  <0.03  --   < > = values in this interval not displayed.  Estimated Creatinine Clearance: 94.3 mL/min (by C-G formula based on Cr of 1.08).   Assessment: 50 y/o M on heparin drip for PE, was on Apixaban for a few days at [facility] [place], aPTT is now therapeutic at 70 (using aPTT to dose for now given apixaban influence on heparin level---falsely elevated at >2.2 this AM).  Goal of Therapy:  Heparin level 0.3-0.7 units/ml aPTT 66-102 seconds Monitor platelets by anticoagulation protocol: Yes   Plan:  -Continue heparin drip at 1600 units/hr -1200 aPTT -Daily CBC/HL/aPTT -Monitor for bleeding  Abran DukeLedford, Greg Eckrich 05/24/2015,5:38  AM

## 2015-05-24 NOTE — Progress Notes (Signed)
Ambulated in hall 35500ft. Pt tolerated well. HR increased to 120s, still SR with frequent PVCs. Sats 93-97 % on RA. After walk pt recorded at 143/82. Pt denied any SOB or CP while walking.

## 2015-05-24 NOTE — H&P (Signed)
PULMONARY / CRITICAL CARE MEDICINE   Name: Jermaine NightJerry L Cantu MRN: [id number] DOB: 09-24-1965    ADMISSION DATE:  05/23/2015  [facility]  [facility]  CHIEF COMPLAINT:  Shortness of breath  SUBJECTIVE:  Slept well.  Denies chest pain, dizziness, shortness of breath.  VITAL SIGNS: BP 108/72 mmHg  Pulse 80  Temp(Src) 97.9 F (36.6 C) (Oral)  Resp 18  SpO2 94%  INTAKE / OUTPUT: I/O last 3 completed shifts: In: 1192.6 [P.O.:420; I.V.:772.6] Out: 400 [Urine:400]  PHYSICAL EXAMINATION: General: pleasant Neuro:  Alert, normal strength HEENT:  Pupils reactive Cardiovascular:  Regular, no murmur Lungs:  No wheeze/rales Abdomen:  Soft, non tender Musculoskeletal:  No edema Skin:  No rashes  LABS:  BMET  Recent Labs Lab 05/22/15 0437 05/23/15 0647 05/24/15 0317  NA 139 140 138  K 4.1 3.6 3.6  CL 108 108 105  CO2 22 23 23   BUN 10 12 11   CREATININE 1.12 1.10 1.08  GLUCOSE 116* 109* 93    Electrolytes  Recent Labs Lab 05/22/15 0002 05/22/15 0437 05/23/15 0647 05/24/15 0317  CALCIUM  --  8.7* 9.0 8.8*  MG 1.9  --   --   --     CBC  Recent Labs Lab 05/22/15 0437 05/23/15 0647 05/24/15 0317  WBC 10.2 8.5 7.1  HGB 15.5 15.2 14.3  HCT 46.2 45.8 42.7  PLT 99* 115* 122*    Coag's  Recent Labs Lab 05/21/15 1659 05/24/15 0317  APTT 26 70*  INR 1.21  --     Liver Enzymes  Recent Labs Lab 05/24/15 0317  AST 17  ALT 14*  ALKPHOS 53  BILITOT 1.4*  ALBUMIN 2.9*    Cardiac Enzymes  Recent Labs Lab 05/22/15 0437 05/22/15 1239 05/23/15 1233  TROPONINI 0.14* <0.03 <0.03    Glucose  Recent Labs Lab 05/21/15 2335  GLUCAP 129*    Imaging No results found.   STUDIES:  12/29 CT chest >> PE with RV:LV ratio 1.9 12/30 Echo >> EF 55 to 60%, mod RV dilation 12/30 Doppler legs b/l >> DVT Rt femoral vein and popliteal vein 12/31 Echo >> improved RV function  CULTURES:  ANTIBIOTICS:  SIGNIFICANT EVENTS: [facility]29 [facility]  [facility] resume eliquis  LINES/TUBES:  DISCUSSION: 50 yo male unprovoked PE and DVT.  [facility].  His low BP on 12/31 might have been related to hypovolemia and resumption of his outpt blood pressure medications versus malposition of BP cuff.  ASSESSMENT / PLAN:  Pulmonary embolism with Rt leg DVT. Plan: - resume eliquis 1/01 - d/c heparin gtt  Hypotension likely from hypovolemia and medications in setting of PE. Hx of HTN. Plan: - Continue LR at 50 ml/hr for now - hold lasix, calan, ramapril  Acute hypoxic respiratory failure 2nd to PE. Plan: - oxygen therapy to keep SpO2 > 92% - assess for home oxygen  Thrombocytopenia >> improving. Plan:  - f/u CBC  Transfer to SDU >> if stable, then possible d/c home 1/02.   Coralyn HellingVineet Kyng Matlock, MD [facility] Pulmonary/Critical Care 05/24/2015, 8:04 AM Pager:  [phone] After 3pm call: [phone]

## 2015-05-24 NOTE — Progress Notes (Signed)
ANTICOAGULATION CONSULT NOTE - Initial Consult  Pharmacy Consult for Eliquis Indication: pulmonary embolus  Allergies  Allergen Reactions  . Penicillins Hives    Has patient had a PCN reaction causing immediate rash, facial/tongue/throat swelling, SOB or lightheadedness with hypotension: Yes Has patient had a PCN reaction causing severe rash involving mucus membranes or skin necrosis: No Has patient had a PCN reaction that required hospitalization No Has patient had a PCN reaction occurring within the last 10 years: No If all of the above answers are "NO", then may proceed with Cephalosporin use.    Patient Measurements: TBW 103.5 kg IBW 68.4 kg Heparin Dosing Weight: 91 kg  Vital Signs: Temp: 97.9 F (36.6 C) (01/01 0736) Temp Source: Oral (01/01 0736) BP: 108/72 mmHg (01/01 0600) Pulse Rate: 80 (01/01 0600)  Labs:  Recent Labs  05/21/15 1659  05/22/15 0437 05/22/15 1239 05/23/15 0647 05/23/15 1233 05/24/15 0317  HGB 18.5*  --  15.5  --  15.2  --  14.3  HCT 55.5*  --  46.2  --  45.8  --  42.7  PLT 139*  --  99*  --  115*  --  122*  APTT 26  --   --   --   --   --  70*  LABPROT 15.5*  --   --   --   --   --   --   INR 1.21  --   --   --   --   --   --   HEPARINUNFRC  --   --  0.28*  --   --   --  >2.20*  CREATININE 1.08  --  1.12  --  1.10  --  1.08  TROPONINI 0.05*  < > 0.14* <0.03  --  <0.03  --   < > = values in this interval not displayed.  Estimated Creatinine Clearance: 94.3 mL/min (by C-G formula based on Cr of 1.08).   Medical History: Past Medical History  Diagnosis Date  . Hypertension     Assessment: 50 [facility] [place] with a PE. Eliquis started at [facility] [place], then switched to heparin for possible EKOS. Pt is hemodynamically stable, will not do EKOS, so now to transition back to Eliquis. hgb 14.3, plt 122K. Eliquis was started on 12/30, last dose 12/31 in AM   Goal of Therapy:  Monitor platelets by anticoagulation  protocol: Yes   Plan:  Eliquis 10 mg po BID through 1/5, then 5mg  BID from 1/6 D/C IV heparin at the same time when Eliqus is given  Bayard HuggerMei Tavaris Eudy, PharmD, BCPS  Clinical Pharmacist  Pager: [phone]   05/24/2015 8:44 AM

## 2015-05-24 NOTE — Progress Notes (Signed)
Referring Physician(s): Dr Craige CottaSood  Chief Complaint:  Shortness of breath +PE/+ Rt heart strain  Subjective:  Ad[facility] 12[facility]30 with SOB No other complaint Work up revealed PE with R heart strain + RLE DVT Treated with Heparin and symptoms were lessened Developed hypotension and [facility] Bonnielee HaffHoss was consulted for possible need for EKOS thrombolysis Dr Craige CottaSood to call IR if needed  BP has stabilized Pt feeling better and breathing easier with no shortness of breath Denies pain; dizziness; denies hemoptysis Now on Eliquis and Heparin infusion  Allergies: Penicillins  Medications: Prior to Admission medications   Medication Sig Start Date End Date Taking? Authorizing Provider  aspirin EC 81 MG tablet Take 81 mg by mouth daily.   Yes Historical Provider, MD  furosemide (LASIX) 40 MG tablet Take 40 mg by mouth daily.  07/31/14  Yes Historical Provider, MD  Multiple Vitamin (MULTIVITAMIN WITH MINERALS) TABS tablet Take 1 tablet by mouth daily.   Yes Historical Provider, MD  pantoprazole (PROTONIX) 40 MG tablet Take 40 mg by mouth daily.  07/31/14  Yes Historical Provider, MD  ramipril (ALTACE) 10 MG capsule Take 10 mg by mouth daily.  07/31/14  Yes Historical Provider, MD  verapamil (CALAN-SR) 240 MG CR tablet Take 240 mg by mouth daily.   Yes Historical Provider, MD     Vital Signs: BP 127/67 mmHg  Pulse 94  Temp(Src) 97.9 F (36.6 C) (Oral)  Resp 18  SpO2 98%  Physical Exam  Constitutional: He is oriented to person, place, and time. He appears well-developed.  Cardiovascular: Normal rate and regular rhythm.   Pulmonary/Chest: Effort normal and breath sounds normal. He has no wheezes.  Abdominal: Soft.  Musculoskeletal: Normal range of motion.  Neurological: He is alert and oriented to person, place, and time.  Skin: Skin is warm.  Psychiatric: He has a normal mood and affect. His behavior is normal. Judgment and  thought content normal.  Nursing note and vitals reviewed.   Imaging: Dg Chest 2 View  05/21/2015  CLINICAL DATA:  Shortness of breath EXAM: CHEST  2 VIEW COMPARISON:  07/13/2006 chest radiograph. FINDINGS: Stable cardiomediastinal silhouette with top-normal heart size. No pneumothorax. No pleural effusion. Clear lungs, with no focal lung consolidation and no pulmonary edema. IMPRESSION: No active cardiopulmonary disease. Electronically Signed   By: Delbert PhenixJason A Poff M.D.   On: 05/21/2015 17:42   Ct Angio Chest Pe W/cm &/or Wo Cm  05/21/2015  CLINICAL DATA:  Initial encounter for cough and difficulty breathing starting 3 days ago but worsening today. EXAM: CT ANGIOGRAPHY CHEST WITH CONTRAST TECHNIQUE: Multidetector CT imaging of the chest was performed using the standard protocol during bolus administration of intravenous contrast. Multiplanar CT image reconstructions and MIPs were obtained to evaluate the vascular anatomy. CONTRAST:  100mL OMNIPAQUE IOHEXOL 350 MG/ML SOLN COMPARISON:  None. FINDINGS: Mediastinum / Lymph Nodes: There is no axillary lymphadenopathy. No mediastinal lymphadenopathy. There is no hilar lymphadenopathy. Heart size is normal. No pericardial effusion. The esophagus has normal imaging features. No thoracic aortic aneurysm. No dissection of the thoracic aorta. Large volume pulmonary embolus is evident in both main pulmonary arteries, extending into lobar and segmental pulmonary arteries of both upper and lower lobes. Thrombus is occlusive in some segmental branches bilaterally. There is paradoxical inversion of the interventricular septum and the RV / LV ratio is 1.9. Lungs / Pleura: Lung windows show no focal airspace consolidation. There is some atelectasis in  the lower lungs bilaterally, left greater than right. No definite pulmonary infarct at this time. No pulmonary edema or pleural effusion. Upper Abdomen: Imaging through the upper abdomen suggests mild gallbladder wall thickening  with potentially a small amount of pericholecystic fluid. Mild lymphadenopathy is associated in the hepatoduodenal ligament. MSK / Soft Tissues: Bone windows reveal no worrisome lytic or sclerotic osseous lesions. Review of the MIP images confirms the above findings. IMPRESSION: 1. Positive for acute PE with CTevidence of right heart strain (RV/LV Ratio = 1.9) consistent with at least submassive (intermediate risk) PE. The presence of right heart strain has been associated with an increased risk of morbidity and mortality. 2. Question mild gallbladder wall thickening with possible pericholecystic edema. Abdominal ultrasound could be used to further evaluate as clinically warranted. 3. Mild hepatoduodenal ligament lymphadenopathy. Critical Value/emergent results were called by me at the time of interpretation on 05/21/2015 at 9:12 pm to Dr. Phineas Semen , who verbally acknowledged these results. Electronically Signed   By: Kennith Center M.D.   On: 05/21/2015 21:12   US Venous Img Lower Bilateral  05/22/2015  ADDENDUM REPORT: 05/22/2015 12:37 ADDENDUM: These results were called by telephone at the time of interpretation on 05/22/2015 at 12:20 pm to Dr. Ramonita Lab , who verbally acknowledged these results. Electronically Signed   By: Bary Richard M.D.   On: 05/22/2015 12:37  05/22/2015  CLINICAL DATA:  Pulmonary emboli. EXAM: BILATERAL LOWER EXTREMITY VENOUS DOPPLER ULTRASOUND TECHNIQUE: Gray-scale sonography with graded compression, as well as color Doppler and duplex ultrasound were performed to evaluate the lower extremity deep venous systems from the level of the common femoral vein and including the common femoral, femoral, profunda femoral, popliteal and calf veins including the posterior tibial, peroneal and gastrocnemius veins when visible. The superficial great saphenous vein was also interrogated. Spectral Doppler was utilized to evaluate flow at rest and with distal augmentation maneuvers in the  common femoral, femoral and popliteal veins. COMPARISON:  None. FINDINGS: RIGHT LOWER EXTREMITY Common Femoral Vein: No evidence of thrombus. Normal compressibility, respiratory phasicity and response to augmentation. Saphenofemoral Junction: No evidence of thrombus. Normal compressibility and flow on color Doppler imaging. Profunda Femoral Vein: No evidence of thrombus. Normal compressibility and flow on color Doppler imaging. Femoral Vein: Nonocclusive thrombus within the proximal right superficial femoral vein. Popliteal Vein: Nonocclusive thrombus throughout the right popliteal vein. Calf Veins: No evidence of thrombus. Normal compressibility and flow on color Doppler imaging. Superficial Great Saphenous Vein: No evidence of thrombus. Normal compressibility and flow on color Doppler imaging. Venous Reflux:  None. Other Findings:  None. LEFT LOWER EXTREMITY Common Femoral Vein: No evidence of thrombus. Normal compressibility, respiratory phasicity and response to augmentation. Saphenofemoral Junction: No evidence of thrombus. Normal compressibility and flow on color Doppler imaging. Profunda Femoral Vein: No evidence of thrombus. Normal compressibility and flow on color Doppler imaging. Femoral Vein: No evidence of thrombus. Normal compressibility, respiratory phasicity and response to augmentation. Popliteal Vein: No evidence of thrombus. Normal compressibility, respiratory phasicity and response to augmentation. Calf Veins: No evidence of thrombus. Normal compressibility and flow on color Doppler imaging. Superficial Great Saphenous Vein: No evidence of thrombus. Normal compressibility and flow on color Doppler imaging. Venous Reflux:  None. Other Findings:  None. IMPRESSION: Nonocclusive deep venous thrombosis within the RIGHT femoral vein and popliteal vein. No deep venous thrombosis identified within the left lower extremity. Electronically Signed: By: Bary Richard M.D. On: 05/22/2015 12:09     Labs:  CBC:  Recent  Labs  05/21/15 1659 05/22/15 0437 05/23/15 0647 05/24/15 0317  WBC 13.4* 10.2 8.5 7.1  HGB 18.5* 15.5 15.2 14.3  HCT 55.5* 46.2 45.8 42.7  PLT 139* 99* 115* 122*    COAGS:  Recent Labs  05/21/15 1659 05/24/15 0317  INR 1.21  --   APTT 26 70*    BMP:  Recent Labs  05/21/15 1659 05/22/15 0437 05/23/15 0647 05/24/15 0317  NA 138 139 140 138  K 3.7 4.1 3.6 3.6  CL 104 108 108 105  CO2 24 22 23 23   GLUCOSE 144* 116* 109* 93  BUN 10 10 12 11   CALCIUM 9.1 8.7* 9.0 8.8*  CREATININE 1.08 1.12 1.10 1.08  GFRNONAA >60 >60 >60 >60  GFRAA >60 >60 >60 >60    LIVER FUNCTION TESTS:  Recent Labs  05/24/15 0317  BILITOT 1.4*  AST 17  ALT 14*  ALKPHOS 53  PROT 6.6  ALBUMIN 2.9*    Assessment and Plan:  +PE with Rt heart strain RLE DVT Symptoms of SOB and pain resolving with medication Plan per Dr Craige Cotta PCCM Note indicates home in am Call us if need [place]  Signed: Rocky Gladden A 05/24/2015, 9:20 AM   I spent a total of 15 Minutes at the the patient's bedside AND on the patient's hospital floor or unit, greater than 50% of which was counseling/coordinating care for PE/+Rt Heart strain; symptomatic

## 2015-05-25 DIAGNOSIS — I2609 Other pulmonary embolism with acute cor pulmonale: Secondary | ICD-10-CM

## 2015-05-25 LAB — BASIC METABOLIC PANEL
Anion gap: 11 (ref 5–15)
BUN: 9 mg/dL (ref 6–20)
CALCIUM: 9 mg/dL (ref 8.9–10.3)
CO2: 23 mmol/L (ref 22–32)
CREATININE: 1.12 mg/dL (ref 0.61–1.24)
Chloride: 105 mmol/L (ref 101–111)
GFR calc Af Amer: >60 mL/min (ref >60)
GFR calc non Af Amer: >60 mL/min (ref >60)
Glucose, Bld: 93 mg/dL (ref 65–99)
POTASSIUM: 3.5 mmol/L (ref 3.5–5.1)
SODIUM: 139 mmol/L (ref 135–145)

## 2015-05-25 LAB — CBC
HCT: 42.5 % (ref 39.0–52.0)
Hemoglobin: 14.5 g/dL (ref 13.0–17.0)
MCH: 32.2 pg (ref 26.0–34.0)
MCHC: 34.1 g/dL (ref 30.0–36.0)
MCV: 94.2 fL (ref 78.0–100.0)
PLATELETS: 121 10*3/uL — AB (ref 150–400)
RBC: 4.51 MIL/uL (ref 4.22–5.81)
RDW: 14.3 % (ref 11.5–15.5)
WBC: 6.2 10*3/uL (ref 4.0–10.5)

## 2015-05-25 MED ORDER — APIXABAN 5 MG PO TABS: 5.0000 mg | ORAL_TABLET | Freq: Two times a day (BID) | ORAL | Status: AC

## 2015-05-25 MED ORDER — APIXABAN 5 MG PO TABS: 10.0000 mg | ORAL_TABLET | Freq: Two times a day (BID) | ORAL | Status: DC

## 2015-05-25 NOTE — Progress Notes (Signed)
PULMONARY / CRITICAL CARE MEDICINE   Name: Venetia NightJerry L Gerald MRN: [id number] DOB: 12-Jun-1965    ADMISSION DATE:  05/23/2015  [facility]  CHIEF COMPLAINT:  Shortness of breath  SUBJECTIVE:  Slept well.  Denies chest pain, dizziness, shortness of breath.  Walked in hall w/o difficulty.  VITAL SIGNS: BP 133/76 mmHg  Pulse 95  Temp(Src) 98.6 F (37 C) (Oral)  Resp 18  Ht 5\' 8"  (1.727 m)  Wt 225 lb 11.2 oz (102.377 kg)  BMI 34.33 kg/m2  SpO2 96%  INTAKE / OUTPUT: I/O last 3 completed shifts: In: 2104.6 [P.O.:900; I.V.:1204.6] Out: 725 [Urine:725]  PHYSICAL EXAMINATION: General: pleasant Neuro:  Alert, normal strength HEENT:  Pupils reactive Cardiovascular:  Regular, no murmur Lungs:  No wheeze/rales Abdomen:  Soft, non tender Musculoskeletal:  No edema Skin:  No rashes  LABS:  BMET  Recent Labs Lab 05/23/15 0647 05/24/15 0317 05/25/15 0315  NA 140 138 139  K 3.6 3.6 3.5  CL 108 105 105  CO2 23 23 23   BUN 12 11 9   CREATININE 1.10 1.08 1.12  GLUCOSE 109* 93 93    Electrolytes  Recent Labs Lab 05/22/15 0002  05/23/15 0647 05/24/15 0317 05/25/15 0315  CALCIUM  --   < > 9.0 8.8* 9.0  MG 1.9  --   --   --   --   < > = values in this interval not displayed.  CBC  Recent Labs Lab 05/23/15 0647 05/24/15 0317 05/25/15 0315  WBC 8.5 7.1 6.2  HGB 15.2 14.3 14.5  HCT 45.8 42.7 42.5  PLT 115* 122* 121*    Coag's  Recent Labs Lab 05/21/15 1659 05/24/15 0317  APTT 26 70*  INR 1.21  --     Liver Enzymes  Recent Labs Lab 05/24/15 0317  AST 17  ALT 14*  ALKPHOS 53  BILITOT 1.4*  ALBUMIN 2.9*    Cardiac Enzymes  Recent Labs Lab 05/22/15 0437 05/22/15 1239 05/23/15 1233  TROPONINI 0.14* <0.03 <0.03    Glucose  Recent Labs Lab 05/21/15 2335  GLUCAP 129*    Imaging No results found.   STUDIES:  12/29 CT chest >> PE with RV:LV ratio 1.9 12/30 Echo >> EF 55 to 60%, mod RV dilation 12/30 Doppler legs b/l >> DVT  Rt femoral vein and popliteal vein 12/31 Echo >> improved RV function  SIGNIFICANT EVENTS: [facility] resume eliquis  DISCUSSION: 50 yo male unprovoked PE and DVT.  [facility].  His low BP on 12/31 might have been related to hypovolemia and resumption of his outpt blood pressure medications versus malposition of BP cuff.  ASSESSMENT / PLAN:  Pulmonary embolism with Rt leg DVT. Plan: - continue eliquis  Hypotension likely from hypovolemia and medications in setting of PE. Hx of HTN. Plan: - hold lasix, calan, ramapril >> advised him to f/u with PCP this week to check BP and then decide about resuming antihypertensive medications  Thrombocytopenia, mild >> improving. Plan:  - f/u CBC as outpt  Okay for d/c home.  He will f/u with his PCP Dr. Hyacinth [place]ekerMiller in [place].  Coralyn HellingVineet Jayjay Littles, MD [facility] Pulmonary/Critical Care 05/25/2015, 10:33 AM Pager:  [phone] After 3pm call: [phone]

## 2015-05-25 NOTE — Progress Notes (Signed)
UR Completed Chace Bisch Graves-Bigelow, RN,BSN [phone]  

## 2015-05-25 NOTE — Care Management Note (Signed)
Case Management Note  Patient Details  Name: Jermaine Blake MRN: [id number] Date of Birth: 1965-10-05  Subjective/Objective:    Pt admitted for SOB, low Bp-found to have positive PE. Plan for home on Eliquis.                 Action/Plan: CM will provide 30 day free and co pay card for Eliquis. Pt uses [facility] in [place] and medication is available. No further needs from CM at this time.   Post Acute Care Choice:  NA Choice offered to:  NA  DME Arranged:  N/A DME Agency:  NA  HH Arranged:  NA HH Agency:  NA  Status of Service:  Completed, signed off  Medicare Important Message Given:    Date Medicare IM Given:    Medicare IM give by:    Date Additional Medicare IM Given:    Additional Medicare Important Message give by:     If discussed at Long Length of Stay Meetings, dates discussed:    Additional Comments:  Gala LewandowskyGraves-Bigelow, Rishika Mccollom Kaye, RN 05/25/2015, 10:08 AM

## 2015-05-25 NOTE — Progress Notes (Signed)
Pt having nose bleed after blowing nose. Bleeding stopped with pressure to nose. Will continue to monitor. Emelda Brothershristy Trung Wenzl RN

## 2015-05-25 NOTE — Discharge Summary (Signed)
Physician Discharge Summary  Patient ID: Jermaine Blake MRN: [id number] DOB/AGE: 10/03/1965 49 y.o.  Admit date: 05/23/2015 Discharge date: 05/25/2015    Discharge Diagnoses:  Unprovoked PE  RLE DVT  Hypotension  Hypovolemia  History of Hypertension  Thrombocytopenia                                                                        DISCHARGE PLAN BY DIAGNOSIS     Unprovoked PE  RLE DVT   Discharge Plan: Pt will need lifelong anticoagulation  Eliquis at discharge.  Pt given card for one month free of medication.  Follow up with PCP  Follow up with Hematology - previously seen in [place] / [facility].   Pt instructed to report to ER for any bleeding he can not control.  Caution with shaving, food prep etc  Hypotension - likely secondary to hypovolemia  Hypovolemia  Hx Hypertension   Discharge Plan: Hold home lasix, calan, ramapril at discharge  Follow up with PCP (Dr. Sabra Heck) in [place] regarding restart of BP medications.    Thrombocytopenia - mild, improving.  Platelets 99 >> 121  Discharge Plan: Follow up CBC with PCP to ensure resolution of thrombocytopenia                  DISCHARGE SUMMARY   Jermaine Blake is a 50 y.o. y/o male with a PMH of hypertension who initially presented to [facility] with coughing, pleuritic chest pain, and hemoptysis.  He was admitted and found to have a submassive PE and Rt leg DVT, thought to be unprovoked. His oxygenation and hemodynamics were stable. He had initial evaluation by critical care, and was not a candidate for thrombolytic therapy. He was started on eliquis. He was seen by hematology at [facility]. Plan was for d/c home on 12/31. He was subsequently given his blood pressure medications, and then ambulated in hall. When he returned to his hospital room he was noted to be hypotensive. He was given IV fluid and blood pressure improved. He was evaluated by cardiology and vascular surgery. There was concern his hemodynamic  instability could have been related to recurrent PE and that he might need thrombolytic therapy. He was therefore transferred to [facility] on 12/31 for further evaluation.  Eliquis was held and patient placed on heparin gtt.  The patient was noted to have mild thrombocytopenia (improved prior to discharge). He was evaluated by IR for catheter directed lysis on 1/1 and felt not to be a candidate as symptoms were resolving.   Heparin gtt was discontinued and he was transitioned back to Eliquis.  He was evaluated by care management and given a card for one month free of anticoagulation.  Repeat ECHO on 12/31 demonstrated improvement in RV function.  The patient was medically cleared for discharge with plans as above.              STUDIES:  12/29 CT chest >> PE with RV:LV ratio 1.9 12/30 Echo >> EF 55 to 60%, mod RV dilation 12/30 Doppler legs b/l >> DVT Rt femoral vein and popliteal vein 12/31 Echo >> improved RV function  SIGNIFICANT EVENTS: 12/29 Admit to [facility] 12/31 Transfer to [facility] 01/01 resume eliquis   Discharge Exam: General:  pleasant Neuro: Alert, normal strength HEENT: Pupils reactive Cardiovascular: Regular, no murmur Lungs: No wheeze/rales Abdomen: Soft, non tender Musculoskeletal: No edema Skin: No rashes  Filed Vitals:   05/24/15 1658 05/24/15 2035 05/25/15 0031 05/25/15 0522  BP: 139/97 120/89 139/88 133/76  Pulse: 98 105 95 95  Temp: 99.2 F (37.3 C) 98.4 F (36.9 C) 98.2 F (36.8 C) 98.6 F (37 C)  TempSrc: Oral Oral Oral Oral  Resp: _0 Height: _1  (1.727 m)     Weight: 225 lb 15.5 oz (102.5 kg)   225 lb 11.2 oz (102.377 kg)  SpO2: 97% 94% 94% 96%     Discharge Labs  BMET  Recent Labs Lab 05/21/15 1659 05/22/15 0002 05/22/15 0437 05/23/15 0647 05/24/15 0317 05/25/15 0315  NA 138  --  139 140 138 139  K 3.7  --  4.1 3.6 3.6 3.5  CL 104  --  108 108 105 105  CO2 24  --  _2 GLUCOSE 144*  --  116* 109* 93 93  BUN 10  --  _3 CREATININE 1.08  --  1.12 1.10 1.08 1.12  CALCIUM 9.1  --  8.7* 9.0 8.8* 9.0  MG  --  1.9  --   --   --   --     CBC  Recent Labs Lab 05/23/15 0647 05/24/15 0317 05/25/15 0315  HGB 15.2 14.3 14.5  HCT 45.8 42.7 42.5  WBC 8.5 7.1 6.2  PLT 115* 122* 121*    Anti-Coagulation  Recent Labs Lab 05/21/15 1659  INR 1.21    Discharge Instructions    Call MD for:  difficulty breathing, headache or visual disturbances    Complete by:  As directed      Call MD for:  extreme fatigue    Complete by:  As directed      Call MD for:  hives    Complete by:  As directed      Call MD for:  persistant dizziness or light-headedness    Complete by:  As directed      Call MD for:  persistant nausea and vomiting    Complete by:  As directed      Call MD for:  severe uncontrolled pain    Complete by:  As directed      Call MD for:  temperature >100.4    Complete by:  As directed      Diet - low sodium heart healthy    Complete by:  As directed      Discharge instructions    Complete by:  As directed   1.  Review medications carefully as they have changed.  2.  Call for follow up with your primary care provider regarding your blood pressure 3.  Call for follow up with Hematology as previously discussed / arranged.  4.  You are now on a blood thinner - please be cautious with shaving, kitchen prep etc.  Report to ER immediately if you have bleeding that you can not control.     Increase activity slowly    Complete by:  As directed                 Follow-up Information    Call Rusty Aus., MD.   Specialty:  Internal Medicine   Why:  Call to be seen within one week for follow up regarding blood pressure.    Contact information:  [place] [address] [phone]       Follow up with Hematology .   Why:  Please call for follow up with Hematology as previously arranged at [facility].         Medication List     STOP taking these medications        furosemide 40 MG tablet  Commonly known as:  LASIX     ramipril 10 MG capsule  Commonly known as:  ALTACE     verapamil 240 MG CR tablet  Commonly known as:  CALAN-SR      TAKE these medications        apixaban 5 MG Tabs tablet  Commonly known as:  ELIQUIS  Take 2 tablets (10 mg total) by mouth 2 (two) times daily.     apixaban 5 MG Tabs tablet  Commonly known as:  ELIQUIS  Take 1 tablet (5 mg total) by mouth 2 (two) times daily.  Start taking on:  05/29/2015     aspirin EC 81 MG tablet  Take 81 mg by mouth daily.     multivitamin with minerals Tabs tablet  Take 1 tablet by mouth daily.     pantoprazole 40 MG tablet  Commonly known as:  PROTONIX  Take 40 mg by mouth daily.          Disposition:  Home. No new home health needs identified during admission.   Discharged Condition: Jermaine Blake has met maximum benefit of inpatient care and is medically stable and cleared for discharge.  Patient is pending follow up as above.      Time spent on disposition:  Greater than 35 minutes.   Signed: Noe Gens, NP-C [facility] Pgr: [phone] Office: [phone]  Chesley Mires, MD [place] Pager:  [phone] After 3pm call: [phone]

## 2015-05-29 ENCOUNTER — Other Ambulatory Visit
Admission: RE | Admit: 2015-05-29 | Discharge: 2015-05-29 | Disposition: A | Discharge status: Home / Self Care | Payer: BLUE CROSS/BLUE SHIELD | Source: Ambulatory Visit | Attending: Internal Medicine | Admitting: Internal Medicine

## 2015-05-29 DIAGNOSIS — I2699 Other pulmonary embolism without acute cor pulmonale: Secondary | ICD-10-CM | POA: Diagnosis present

## 2015-05-29 LAB — FIBRIN DERIVATIVES D-DIMER (ARMC ONLY): Fibrin derivatives D-dimer (ARMC): 2287 — ABNORMAL HIGH (ref 0–499)

## 2015-06-09 ENCOUNTER — Inpatient Hospital Stay (HOSPITAL_BASED_OUTPATIENT_CLINIC_OR_DEPARTMENT_OTHER): Payer: BLUE CROSS/BLUE SHIELD

## 2015-06-09 ENCOUNTER — Inpatient Hospital Stay: Payer: BLUE CROSS/BLUE SHIELD | Attending: Oncology | Admitting: Oncology

## 2015-06-09 ENCOUNTER — Encounter: Payer: Self-pay | Admitting: Oncology

## 2015-06-09 VITALS — BP 128/85 | HR 97 | Temp 99.1°F | Resp 18 | Ht 66.93 in | Wt 226.9 lb

## 2015-06-09 DIAGNOSIS — D696 Thrombocytopenia, unspecified: Secondary | ICD-10-CM | POA: Insufficient documentation

## 2015-06-09 DIAGNOSIS — I2699 Other pulmonary embolism without acute cor pulmonale: Secondary | ICD-10-CM

## 2015-06-09 DIAGNOSIS — I10 Essential (primary) hypertension: Secondary | ICD-10-CM | POA: Insufficient documentation

## 2015-06-09 DIAGNOSIS — Z7901 Long term (current) use of anticoagulants: Secondary | ICD-10-CM | POA: Insufficient documentation

## 2015-06-09 DIAGNOSIS — Z79899 Other long term (current) drug therapy: Secondary | ICD-10-CM | POA: Diagnosis not present

## 2015-06-09 DIAGNOSIS — Z86711 Personal history of pulmonary embolism: Secondary | ICD-10-CM | POA: Diagnosis not present

## 2015-06-09 DIAGNOSIS — Z86718 Personal history of other venous thrombosis and embolism: Secondary | ICD-10-CM | POA: Insufficient documentation

## 2015-06-10 LAB — ANTITHROMBIN III: AntiThromb III Func: 93 % (ref 75–120)

## 2015-06-10 NOTE — Progress Notes (Signed)
[facility]  Telephone:(336) [phone] Fax:(336) [phone]  ID: Jermaine Blake OB: 1966/03/17  MR#: [id number]  AOZ#:[id number]  Patient Care Team: Danella Penton, MD as PCP - General (Internal Medicine)  CHIEF COMPLAINT:  Chief Complaint  Patient presents with  . New Evaluation    DVT/evaluated in ER 04/2015    INTERVAL HISTORY: Patient is a 50 year old male who was initially evaluated in the hospital after having a large lower extremity DVT as well as bilateral pulmonary embolism. Patient was transferred to [facility] for consideration of thrombectomy, but never required this procedure. He currently feels well and is back to his baseline. He denies any further chest pain or shortness of breath. He has no neurologic complaints. He has a good appetite and denies weight loss. He denies any fevers. He has no nausea, vomiting, constipation, or diarrhea. He has no urinary complaints. Patient feels at his baseline and offers no specific complaints today.  REVIEW OF SYSTEMS:   Review of Systems  Constitutional: Negative.   Respiratory: Negative.  Negative for shortness of breath.   Cardiovascular: Negative.  Negative for chest pain.  Gastrointestinal: Negative.  Negative for blood in stool and melena.  Musculoskeletal: Negative.   Neurological: Negative.   Endo/Heme/Allergies: Does not bruise/bleed easily.    As per HPI. Otherwise, a complete review of systems is negatve.  PAST MEDICAL HISTORY: Past Medical History  Diagnosis Date  . Hypertension   . DVT (deep venous thrombosis) (HCC)     PAST SURGICAL HISTORY: No past surgical history on file.  FAMILY HISTORY Family History  Problem Relation Age of Onset  . Hypertension Other        ADVANCED DIRECTIVES:    HEALTH MAINTENANCE: Social History  Substance Use Topics  . Smoking status: Never Smoker   . Smokeless tobacco: Not on file  . Alcohol Use: Yes     Comment: socially      Colonoscopy:  PAP:  Bone density:  Lipid panel:  Allergies  Allergen Reactions  . Penicillins Hives    Has patient had a PCN reaction causing immediate rash, facial/tongue/throat swelling, SOB or lightheadedness with hypotension: Yes Has patient had a PCN reaction causing severe rash involving mucus membranes or skin necrosis: No Has patient had a PCN reaction that required hospitalization No Has patient had a PCN reaction occurring within the last 10 years: No If all of the above answers are "NO", then may proceed with Cephalosporin use.    Current Outpatient Prescriptions  Medication Sig Dispense Refill  . apixaban (ELIQUIS) 5 MG TABS tablet Take 1 tablet (5 mg total) by mouth 2 (two) times daily. 60 tablet 11  . furosemide (LASIX) 40 MG tablet   10  . pantoprazole (PROTONIX) 40 MG tablet Take 40 mg by mouth daily.     . verapamil (CALAN-SR) 240 MG CR tablet   10   No current facility-administered medications for this visit.    OBJECTIVE: Filed Vitals:   06/09/15 1142  BP: 128/85  Pulse: 97  Temp: 99.1 F (37.3 C)  Resp: 18     Body mass index is 35.61 kg/(m^2).    ECOG FS:0 - Asymptomatic  General: Well-developed, well-nourished, no acute distress. Eyes: Pink conjunctiva, anicteric sclera. Lungs: Clear to auscultation bilaterally. Heart: Regular rate and rhythm. No rubs, murmurs, or gallops. Abdomen: Soft, nontender, nondistended. No organomegaly noted, normoactive bowel sounds. Musculoskeletal: No edema, cyanosis, or clubbing. Neuro: Alert, answering all questions appropriately. Cranial nerves grossly intact. Skin: No rashes  or petechiae noted. Psych: Normal affect.  LAB RESULTS:  Lab Results  Component Value Date   NA 139 05/25/2015   K 3.5 05/25/2015   CL 105 05/25/2015   CO2 23 05/25/2015   GLUCOSE 93 05/25/2015   BUN 9 05/25/2015   CREATININE 1.12 05/25/2015   CALCIUM 9.0 05/25/2015   PROT 6.6 05/24/2015   ALBUMIN 2.9* 05/24/2015   AST 17  05/24/2015   ALT 14* 05/24/2015   ALKPHOS 53 05/24/2015   BILITOT 1.4* 05/24/2015   GFRNONAA >60 05/25/2015   GFRAA >60 05/25/2015    Lab Results  Component Value Date   WBC 6.2 05/25/2015   NEUTROABS 9.2* 05/21/2015   HGB 14.5 05/25/2015   HCT 42.5 05/25/2015   MCV 94.2 05/25/2015   PLT 121* 05/25/2015     STUDIES: Dg Chest 2 View  05/21/2015  CLINICAL DATA:  Shortness of breath EXAM: CHEST  2 VIEW COMPARISON:  07/13/2006 chest radiograph. FINDINGS: Stable cardiomediastinal silhouette with top-normal heart size. No pneumothorax. No pleural effusion. Clear lungs, with no focal lung consolidation and no pulmonary edema. IMPRESSION: No active cardiopulmonary disease. Electronically Signed   By: Delbert Phenix M.D.   On: 05/21/2015 17:42   Ct Angio Chest Pe W/cm &/or Wo Cm  05/21/2015  CLINICAL DATA:  Initial encounter for cough and difficulty breathing starting 3 days ago but worsening today. EXAM: CT ANGIOGRAPHY CHEST WITH CONTRAST TECHNIQUE: Multidetector CT imaging of the chest was performed using the standard protocol during bolus administration of intravenous contrast. Multiplanar CT image reconstructions and MIPs were obtained to evaluate the vascular anatomy. CONTRAST:  OMNIPAQUE IOHEXOL 350 MG/ML SOLN COMPARISON:  None. FINDINGS: Mediastinum / Lymph Nodes: There is no axillary lymphadenopathy. No mediastinal lymphadenopathy. There is no hilar lymphadenopathy. Heart size is normal. No pericardial effusion. The esophagus has normal imaging features. No thoracic aortic aneurysm. No dissection of the thoracic aorta. Large volume pulmonary embolus is evident in both main pulmonary arteries, extending into lobar and segmental pulmonary arteries of both upper and lower lobes. Thrombus is occlusive in some segmental branches bilaterally. There is paradoxical inversion of the interventricular septum and the RV / LV ratio is 1.9. Lungs / Pleura: Lung windows show no focal airspace  consolidation. There is some atelectasis in the lower lungs bilaterally, left greater than right. No definite pulmonary infarct at this time. No pulmonary edema or pleural effusion. Upper Abdomen: Imaging through the upper abdomen suggests mild gallbladder wall thickening with potentially a small amount of pericholecystic fluid. Mild lymphadenopathy is associated in the hepatoduodenal ligament. MSK / Soft Tissues: Bone windows reveal no worrisome lytic or sclerotic osseous lesions. Review of the MIP images confirms the above findings. IMPRESSION: 1. Positive for acute PE with CTevidence of right heart strain (RV/LV Ratio = 1.9) consistent with at least submassive (intermediate risk) PE. The presence of right heart strain has been associated with an increased risk of morbidity and mortality. 2. Question mild gallbladder wall thickening with possible pericholecystic edema. Abdominal ultrasound could be used to further evaluate as clinically warranted. 3. Mild hepatoduodenal ligament lymphadenopathy. Critical Value/emergent results were called by me at the time of interpretation on 05/21/2015 at 9:12 pm to Dr. Phineas Semen , who verbally acknowledged these results. Electronically Signed   By: Kennith Center M.D.   On: 05/21/2015 21:12   US Venous Img Lower Bilateral  05/22/2015  ADDENDUM REPORT: 05/22/2015 12:37 ADDENDUM: These results were called by telephone at the time of interpretation on 05/22/2015 at  12:20 pm to Dr. Ramonita Lab , who verbally acknowledged these results. Electronically Signed   By: Bary Richard M.D.   On: 05/22/2015 12:37  05/22/2015  CLINICAL DATA:  Pulmonary emboli. EXAM: BILATERAL LOWER EXTREMITY VENOUS DOPPLER ULTRASOUND TECHNIQUE: Gray-scale sonography with graded compression, as well as color Doppler and duplex ultrasound were performed to evaluate the lower extremity deep venous systems from the level of the common femoral vein and including the common femoral, femoral, profunda  femoral, popliteal and calf veins including the posterior tibial, peroneal and gastrocnemius veins when visible. The superficial great saphenous vein was also interrogated. Spectral Doppler was utilized to evaluate flow at rest and with distal augmentation maneuvers in the common femoral, femoral and popliteal veins. COMPARISON:  None. FINDINGS: RIGHT LOWER EXTREMITY Common Femoral Vein: No evidence of thrombus. Normal compressibility, respiratory phasicity and response to augmentation. Saphenofemoral Junction: No evidence of thrombus. Normal compressibility and flow on color Doppler imaging. Profunda Femoral Vein: No evidence of thrombus. Normal compressibility and flow on color Doppler imaging. Femoral Vein: Nonocclusive thrombus within the proximal right superficial femoral vein. Popliteal Vein: Nonocclusive thrombus throughout the right popliteal vein. Calf Veins: No evidence of thrombus. Normal compressibility and flow on color Doppler imaging. Superficial Great Saphenous Vein: No evidence of thrombus. Normal compressibility and flow on color Doppler imaging. Venous Reflux:  None. Other Findings:  None. LEFT LOWER EXTREMITY Common Femoral Vein: No evidence of thrombus. Normal compressibility, respiratory phasicity and response to augmentation. Saphenofemoral Junction: No evidence of thrombus. Normal compressibility and flow on color Doppler imaging. Profunda Femoral Vein: No evidence of thrombus. Normal compressibility and flow on color Doppler imaging. Femoral Vein: No evidence of thrombus. Normal compressibility, respiratory phasicity and response to augmentation. Popliteal Vein: No evidence of thrombus. Normal compressibility, respiratory phasicity and response to augmentation. Calf Veins: No evidence of thrombus. Normal compressibility and flow on color Doppler imaging. Superficial Great Saphenous Vein: No evidence of thrombus. Normal compressibility and flow on color Doppler imaging. Venous Reflux:  None.  Other Findings:  None. IMPRESSION: Nonocclusive deep venous thrombosis within the RIGHT femoral vein and popliteal vein. No deep venous thrombosis identified within the left lower extremity. Electronically Signed: By: Bary Richard M.D. On: 05/22/2015 12:09    ASSESSMENT: Extensive unprovoked DVT and PE.  PLAN:    1. PE/DVT: CT scan and ultrasound results reviewed independently. Patient had no obvious transient risk factors. He will require a minimum of 6 months of anticoagulation. Full hypercoagulable workup was drawn today and is currently pending. Continue until Eliquis as prescribed. Patient has no obvious malignancy, but given the edema previously seen around his gallbladder can consider CT of the abdomen and pelvis for further evaluation. Return to clinic in 3 months to discuss his laboratory work and further evaluation.  2. Thromocytopenia: Mild, monitor.  Patient expressed understanding and was in agreement with this plan. He also understands that He can call clinic at any time with any questions, concerns, or complaints.    Jeralyn Ruths, MD   06/10/2015 2:29 PM

## 2015-06-15 LAB — DRVVT CONFIRM: DRVVT CONFIRM: 0.8 ratio (ref 0.8–1.2)

## 2015-06-15 LAB — CARDIOLIPIN ANTIBODIES, IGG, IGM, IGA: Anticardiolipin IgA: <9 APL U/mL (ref 0–11)

## 2015-06-15 LAB — PROTHROMBIN GENE MUTATION

## 2015-06-15 LAB — LUPUS ANTICOAGULANT PANEL
DRVVT: 65.3 s — AB (ref 0.0–44.0)
PTT LA: 33.7 s (ref 0.0–40.6)

## 2015-06-15 LAB — BETA-2-GLYCOPROTEIN I ABS, IGG/M/A: Beta-2-Glycoprotein I IgA: <9 GPI IgA units (ref 0–25)

## 2015-06-15 LAB — PROTEIN S, TOTAL: Protein S Ag, Total: 67 % (ref 60–150)

## 2015-06-15 LAB — HOMOCYSTEINE: Homocysteine: 16.8 umol/L — ABNORMAL HIGH (ref 0.0–15.0)

## 2015-06-15 LAB — FACTOR 5 LEIDEN

## 2015-06-15 LAB — PROTEIN C ACTIVITY: Protein C Activity: 116 % (ref 73–180)

## 2015-06-15 LAB — DRVVT MIX: dRVVT Mix: 49.4 s — ABNORMAL HIGH (ref 0.0–44.0)

## 2015-06-15 LAB — PROTEIN C, TOTAL: Protein C, Total: 82 % (ref 60–150)

## 2015-06-15 LAB — PROTEIN S ACTIVITY: PROTEIN S ACTIVITY: 137 % (ref 63–140)

## 2015-09-08 ENCOUNTER — Ambulatory Visit: Payer: BLUE CROSS/BLUE SHIELD | Admitting: Oncology

## 2015-09-08 ENCOUNTER — Inpatient Hospital Stay: Payer: BLUE CROSS/BLUE SHIELD | Attending: Oncology | Admitting: Oncology

## 2015-09-08 VITALS — BP 126/80 | HR 98 | Temp 98.7°F | Resp 16 | Wt 233.0 lb

## 2015-09-08 DIAGNOSIS — Z7901 Long term (current) use of anticoagulants: Secondary | ICD-10-CM | POA: Diagnosis not present

## 2015-09-08 DIAGNOSIS — D6851 Activated protein C resistance: Secondary | ICD-10-CM | POA: Diagnosis present

## 2015-09-08 DIAGNOSIS — Z86711 Personal history of pulmonary embolism: Secondary | ICD-10-CM

## 2015-09-08 NOTE — Progress Notes (Signed)
Patient does not offer any problems today.  

## 2015-09-09 NOTE — Progress Notes (Signed)
[facility]  Telephone:(336) [phone] Fax:(336) [phone]  ID: Jermaine HutchingJerry L Blowe OB: 15-Nov-1965  MR#: [id number]  AOZ#:[id number]#:[id number]  Patient Care Team: Danella PentonMark F Miller, MD as PCP - General (Internal Medicine)  CHIEF COMPLAINT:  Chief Complaint  Patient presents with  . PE/DVT f/u    INTERVAL HISTORY: Patient Returns to clinic today for further evaluation and discussion of his laboratory work. He currently feels well and is asymptomatic. He denies any easy bleeding or bruising.   He denies any chest pain or shortness of breath. He has no neurologic complaints. He has a good appetite and denies weight loss. He denies any fevers. He has no nausea, vomiting, constipation, or diarrhea. He has no urinary complaints. Patient feels at his baseline and offers no specific complaints today.  REVIEW OF SYSTEMS:   Review of Systems  Constitutional: Negative.   Respiratory: Negative.  Negative for shortness of breath.   Cardiovascular: Negative.  Negative for chest pain.  Gastrointestinal: Negative.  Negative for blood in stool and melena.  Musculoskeletal: Negative.   Neurological: Negative.   Endo/Heme/Allergies: Does not bruise/bleed easily.    As per HPI. Otherwise, a complete review of systems is negatve.  PAST MEDICAL HISTORY: Past Medical History  Diagnosis Date  . Hypertension   . DVT (deep venous thrombosis) (HCC)     PAST SURGICAL HISTORY: No past surgical history on file.  FAMILY HISTORY Family History  Problem Relation Age of Onset  . Hypertension Other        ADVANCED DIRECTIVES:    HEALTH MAINTENANCE: Social History  Substance Use Topics  . Smoking status: Never Smoker   . Smokeless tobacco: Not on file  . Alcohol Use: Yes     Comment: socially     Colonoscopy:  PAP:  Bone density:  Lipid panel:  Allergies  Allergen Reactions  . Penicillins Hives    Has patient had a PCN reaction causing immediate rash, facial/tongue/throat swelling, SOB  or lightheadedness with hypotension: Yes Has patient had a PCN reaction causing severe rash involving mucus membranes or skin necrosis: No Has patient had a PCN reaction that required hospitalization No Has patient had a PCN reaction occurring within the last 10 years: No If all of the above answers are "NO", then may proceed with Cephalosporin use.    Current Outpatient Prescriptions  Medication Sig Dispense Refill  . apixaban (ELIQUIS) 5 MG TABS tablet Take 1 tablet (5 mg total) by mouth 2 (two) times daily. 60 tablet 11  . furosemide (LASIX) 40 MG tablet   10  . pantoprazole (PROTONIX) 40 MG tablet Take 40 mg by mouth daily.     . verapamil (CALAN-SR) 240 MG CR tablet   10   No current facility-administered medications for this visit.    OBJECTIVE: Filed Vitals:   09/08/15 1127  BP: 126/80  Pulse: 98  Temp: 98.7 F (37.1 C)  Resp: 16     Body mass index is 36.57 kg/(m^2).    ECOG FS:0 - Asymptomatic  General: Well-developed, well-nourished, no acute distress. Eyes: Pink conjunctiva, anicteric sclera. Lungs: Clear to auscultation bilaterally. Heart: Regular rate and rhythm. No rubs, murmurs, or gallops. Abdomen: Soft, nontender, nondistended. No organomegaly noted, normoactive bowel sounds. Musculoskeletal: No edema, cyanosis, or clubbing. Neuro: Alert, answering all questions appropriately. Cranial nerves grossly intact. Skin: No rashes or petechiae noted. Psych: Normal affect.  LAB RESULTS:  Lab Results  Component Value Date   NA 139 05/25/2015   K 3.5 05/25/2015  CL 105 05/25/2015   CO2 23 05/25/2015   GLUCOSE 93 05/25/2015   BUN 9 05/25/2015   CREATININE 1.12 05/25/2015   CALCIUM 9.0 05/25/2015   PROT 6.6 05/24/2015   ALBUMIN 2.9* 05/24/2015   AST 17 05/24/2015   ALT 14* 05/24/2015   ALKPHOS 53 05/24/2015   BILITOT 1.4* 05/24/2015   GFRNONAA >60 05/25/2015   GFRAA >60 05/25/2015    Lab Results  Component Value Date   WBC 6.2 05/25/2015   NEUTROABS  9.2* 05/21/2015   HGB 14.5 05/25/2015   HCT 42.5 05/25/2015   MCV 94.2 05/25/2015   PLT 121* 05/25/2015     STUDIES: No results found.  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  PLAN:    1. PE/DVT: CT scan and ultrasound results previously reviewed independently. Patient's entire hypercoagulable workup is negative except for heterozygote for factor V Leiden. After lengthy discussion with the patient, he has elected to stay on lifelong anticoagulation. Given the extent of his pulmonary embolism and a 4-8 fold increased risk of a second PE, this is a very reasonable course of action. Continue Eliquis indefinitely. Patient can continue getting refills from his primary care physician. No follow-up has been scheduled.  2. Thromocytopenia: Mild, monitor.  Proximal with 30 minutes was spent in discussion of which greater than 50% was consultation.  Patient expressed understanding and was in agreement with this plan. He also understands that He can call clinic at any time with any questions, concerns, or complaints.    Jeralyn Ruths, MD   09/09/2015 10:55 AM

## 2016-12-13 ENCOUNTER — Other Ambulatory Visit: Payer: Self-pay | Admitting: Cardiology

## 2016-12-13 DIAGNOSIS — R0602 Shortness of breath: Secondary | ICD-10-CM

## 2016-12-21 ENCOUNTER — Ambulatory Visit: Payer: BLUE CROSS/BLUE SHIELD

## 2017-03-10 IMAGING — US US EXTREM LOW VENOUS BILAT
1 series · 13 of 24 positions shown · non-contrast
Comparison: None.

ADDENDUM:
These results were called by telephone at the time of interpretation
on 05/22/2015 at [DATE] to Dr. TSEDI HESSE , who verbally
acknowledged these results.
CLINICAL DATA: Pulmonary emboli.



[Series 1: us extrem low venous bilat · 0.09mm/px · 13 of 69 slices shown]
[im 1/69]
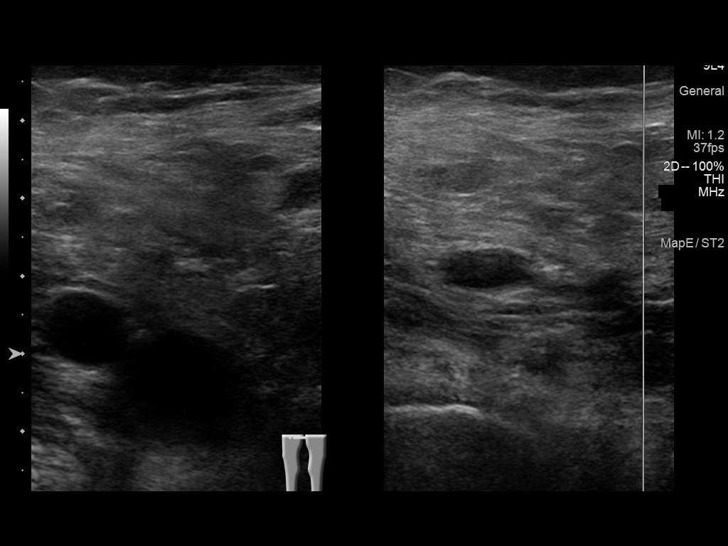
[im 6/69]
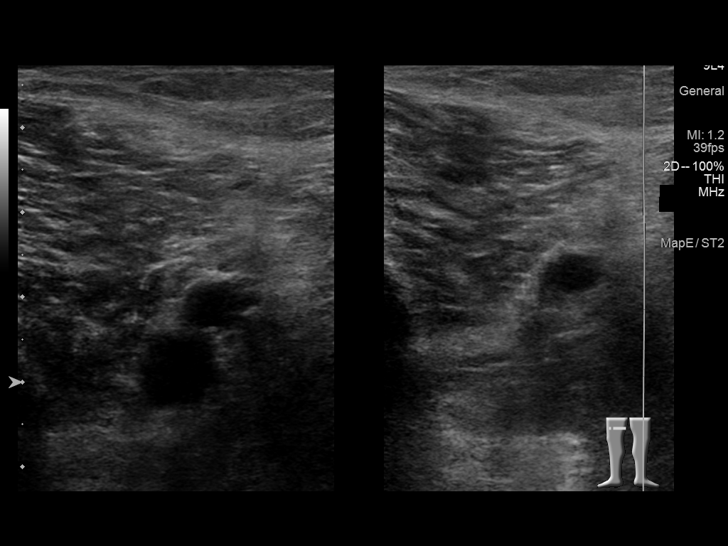
[im 12/69]
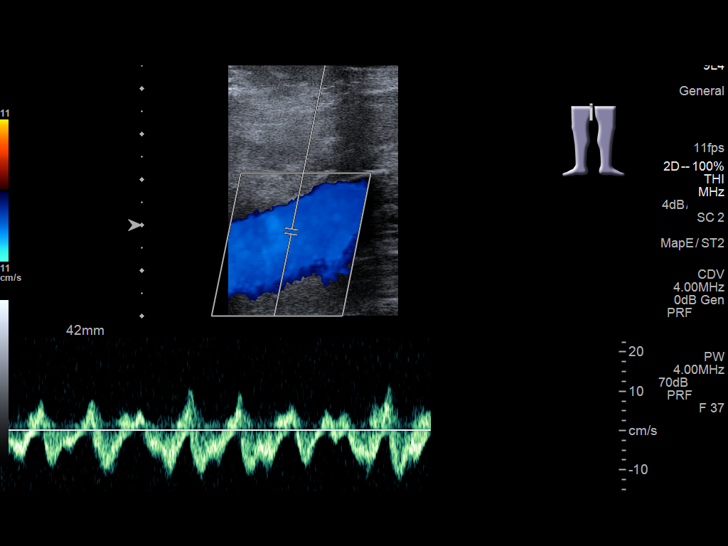
[im 18/69]
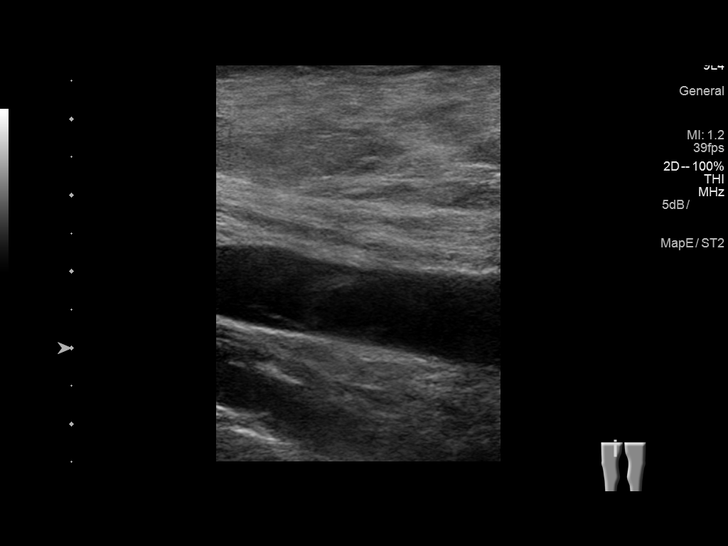
[im 24/69]
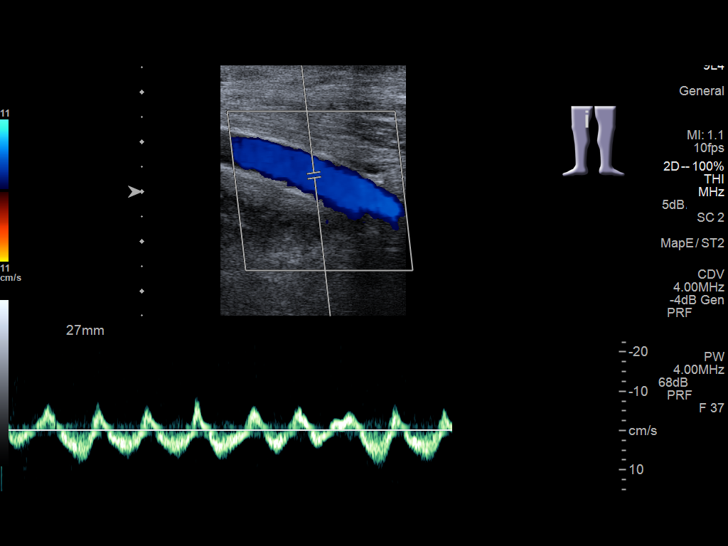
[im 30/69]
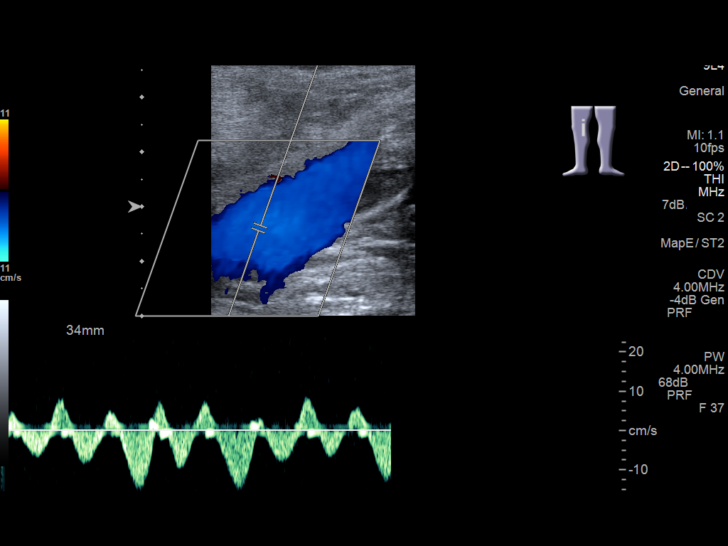
[im 36/69]
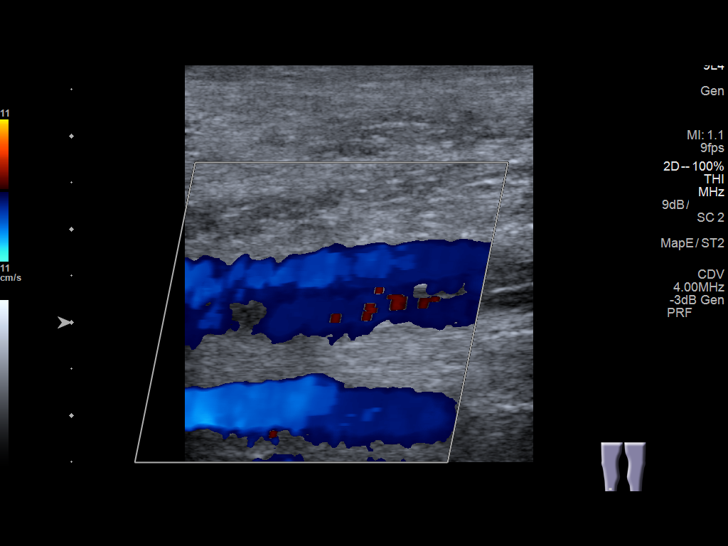
[im 39/69]
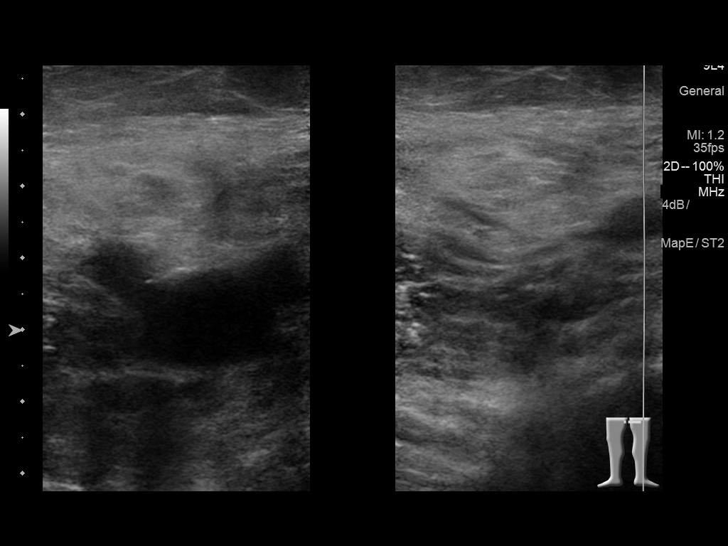
[im 45/69]
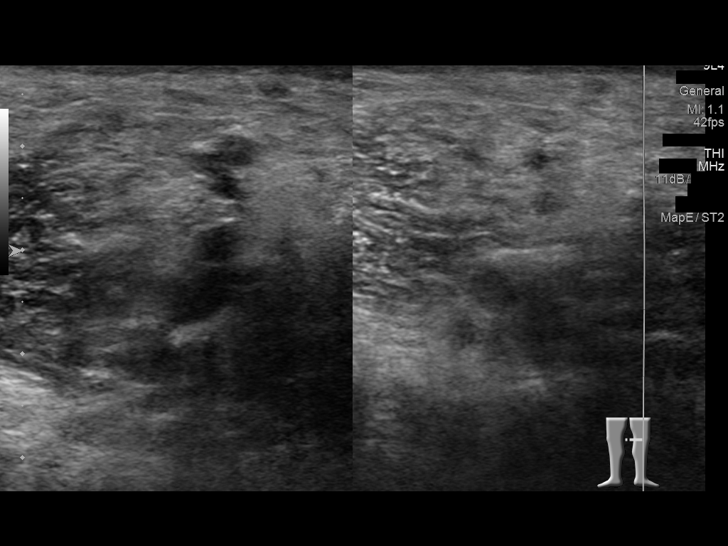
[im 51/69]
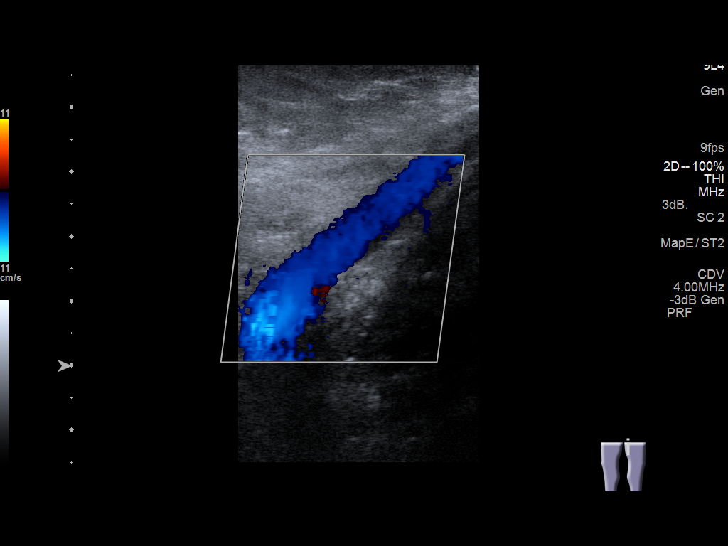
[im 57/69]
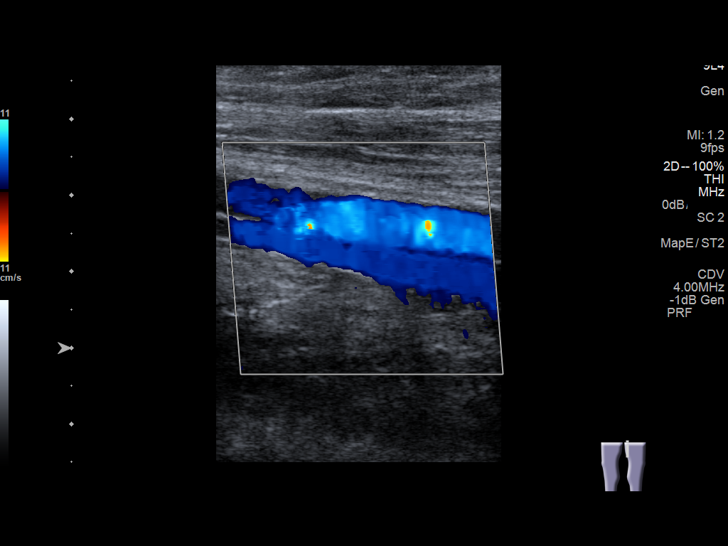
[im 63/69]
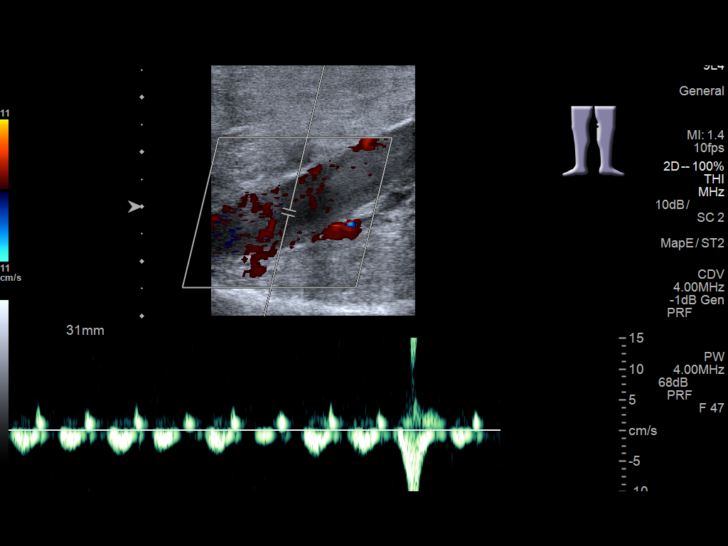
[im 69/69]
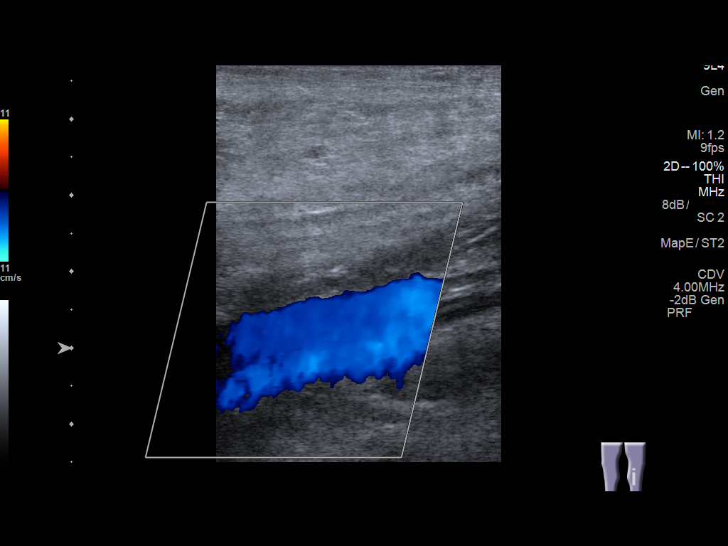

[13 of 24 positions shown; findings below may reference images not displayed]

FINDINGS: RIGHT LOWER EXTREMITY

Common Femoral Vein: No evidence of thrombus. Normal
compressibility, respiratory phasicity and response to augmentation.

Saphenofemoral Junction: No evidence of thrombus. Normal
compressibility and flow on color Doppler imaging.

Profunda Femoral Vein: No evidence of thrombus. Normal
compressibility and flow on color Doppler imaging.

Femoral Vein: Nonocclusive thrombus within the proximal right
superficial femoral vein.

Popliteal Vein: Nonocclusive thrombus throughout the right popliteal
vein.

Calf Veins: No evidence of thrombus. Normal compressibility and flow
on color Doppler imaging.

Superficial Great Saphenous Vein: No evidence of thrombus. Normal
compressibility and flow on color Doppler imaging.

Venous Reflux:  None.

Other Findings:  None.

LEFT LOWER EXTREMITY

Common Femoral Vein: No evidence of thrombus. Normal
compressibility, respiratory phasicity and response to augmentation.

Saphenofemoral Junction: No evidence of thrombus. Normal
compressibility and flow on color Doppler imaging.

Profunda Femoral Vein: No evidence of thrombus. Normal
compressibility and flow on color Doppler imaging.

Femoral Vein: No evidence of thrombus. Normal compressibility,
respiratory phasicity and response to augmentation.

Popliteal Vein: No evidence of thrombus. Normal compressibility,
respiratory phasicity and response to augmentation.

Calf Veins: No evidence of thrombus. Normal compressibility and flow
on color Doppler imaging.

Superficial Great Saphenous Vein: No evidence of thrombus. Normal
compressibility and flow on color Doppler imaging.

Venous Reflux:  None.

Other Findings:  None.
IMPRESSION: Nonocclusive deep venous thrombosis within the RIGHT femoral vein
and popliteal vein.

No deep venous thrombosis identified within the left lower
extremity.

## 2017-07-15 IMAGING — CT CT ANGIO CHEST
1 of 2 series · 18 of 30 positions shown · IV contrast (APPLIED)
Comparison: None.

CLINICAL DATA: Initial encounter for cough and difficulty breathing
starting 3 days ago but worsening today.

EXAM:
CT ANGIOGRAPHY CHEST WITH CONTRAST
TECHNIQUE: Multidetector CT imaging of the chest was performed using the
standard protocol during bolus administration of intravenous
contrast. Multiplanar CT image reconstructions and MIPs were
obtained to evaluate the vascular anatomy.
CONTRAST:  100mL OMNIPAQUE IOHEXOL 350 MG/ML SOLN

[Series 5: pe 1.0 thins · axial · 0.94mm/px · z∈[-162,+132]mm · 18 of 332 slices shown]
[im 19/332  lung]
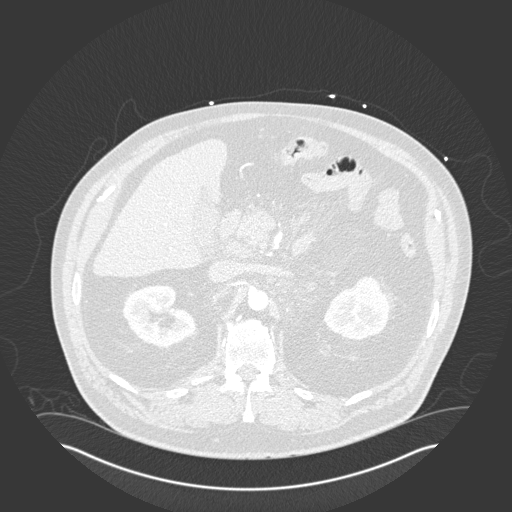
[im 37/332  mediastinal]
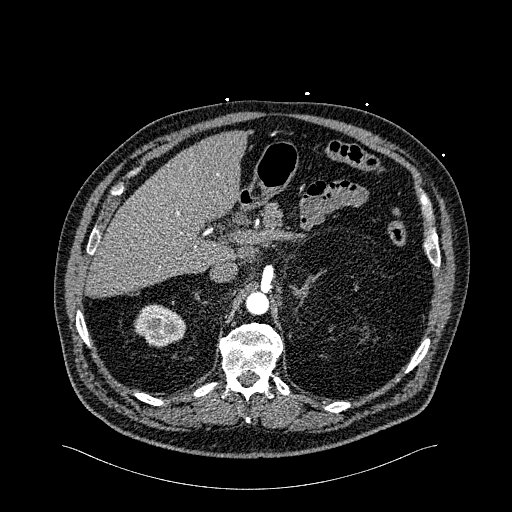
[im 56/332  lung]
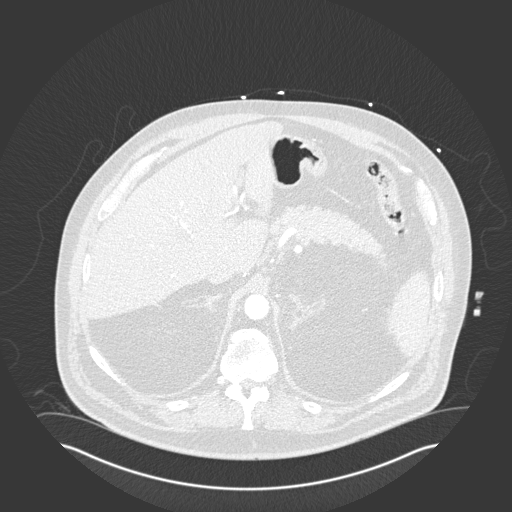
[im 74/332  mediastinal]
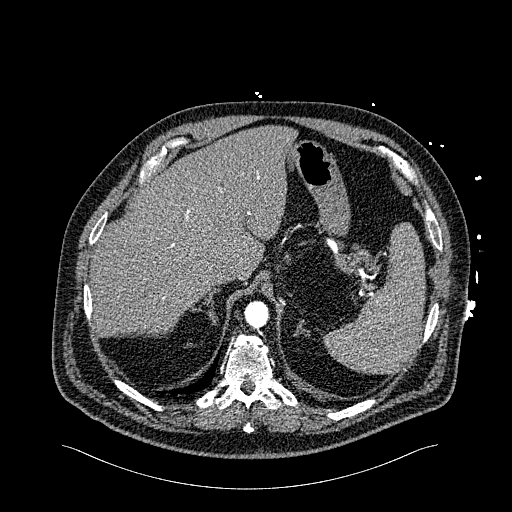
[im 92/332  lung]
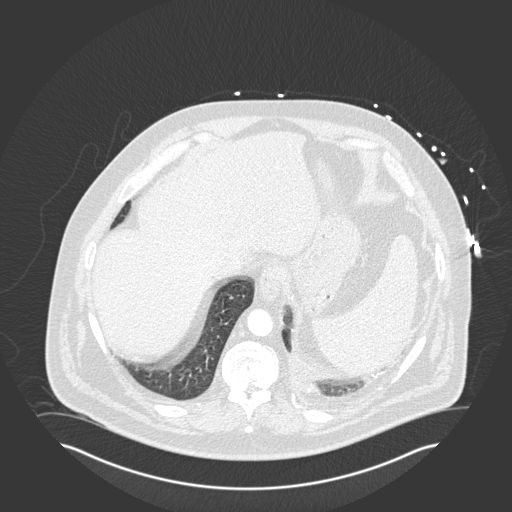
[im 111/332  mediastinal]
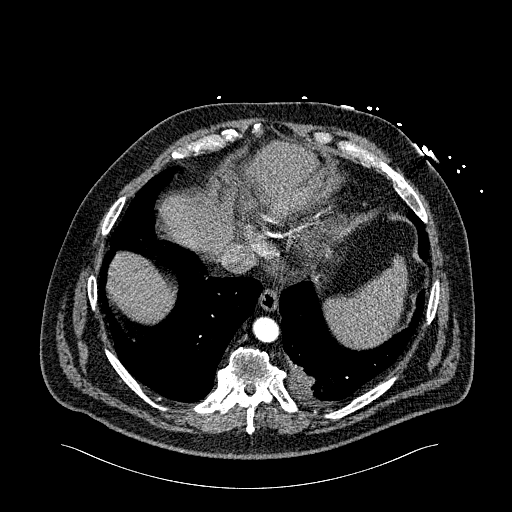
[im 129/332  lung]
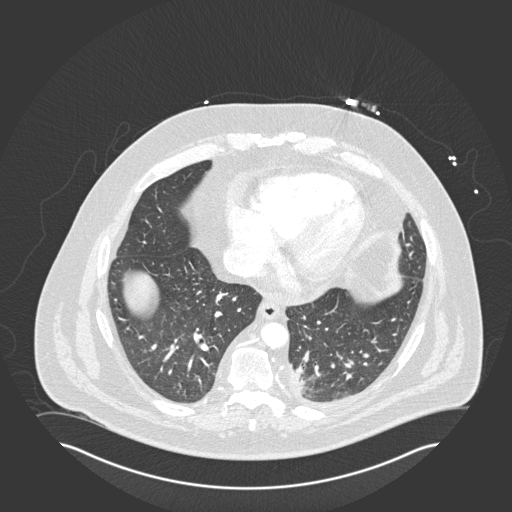
[im 148/332  mediastinal]
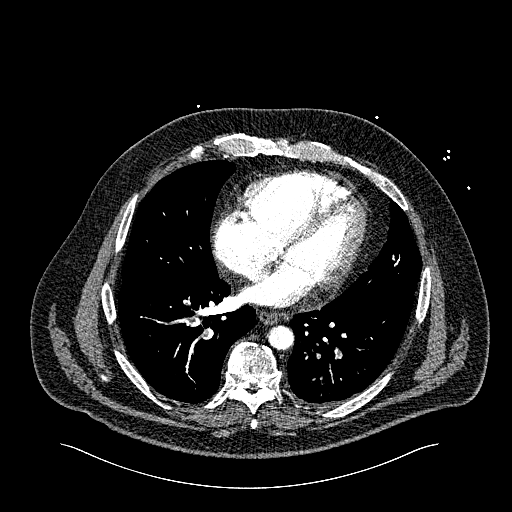
[im 157/332  lung]
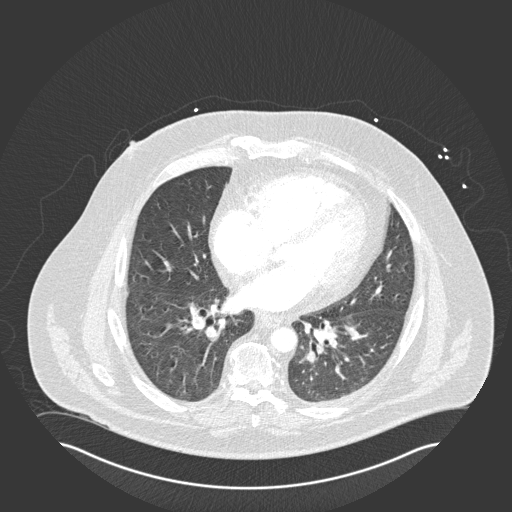
[im 166/332  mediastinal]
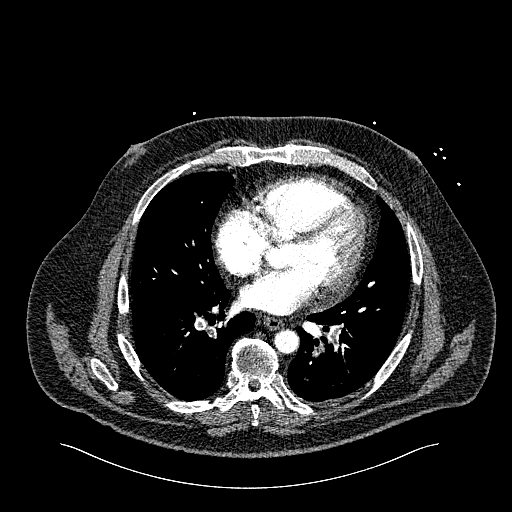
[im 184/332  lung]
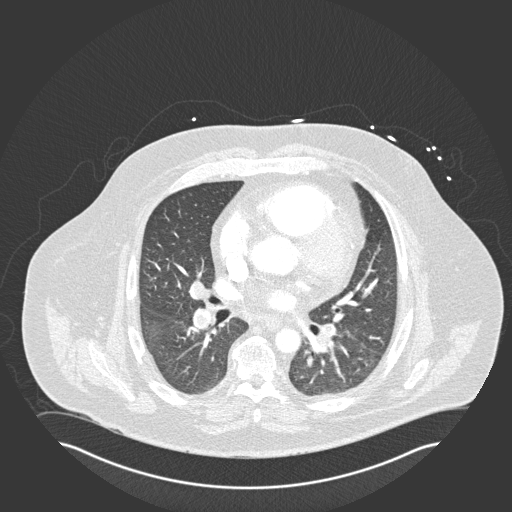
[im 203/332  mediastinal]
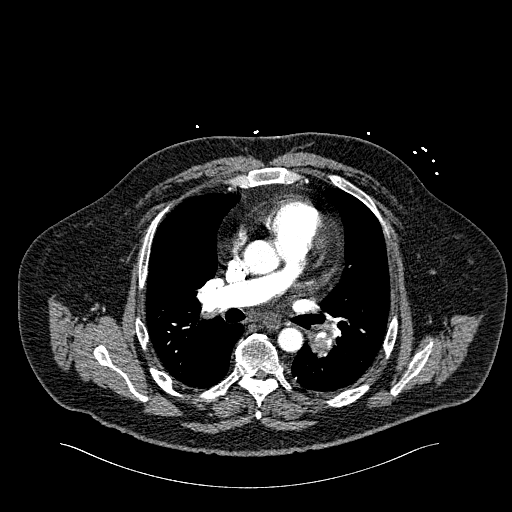
[im 221/332  lung]
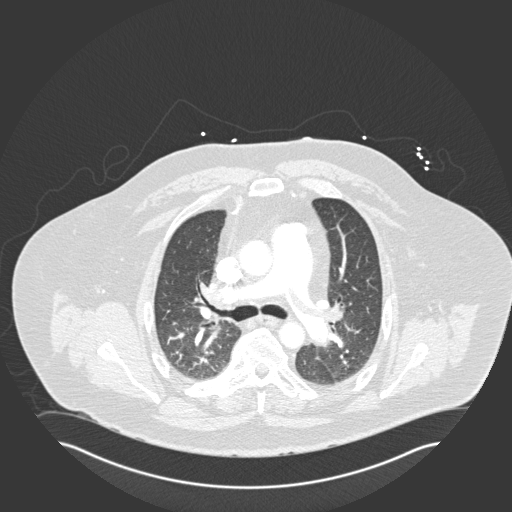
[im 240/332  mediastinal]
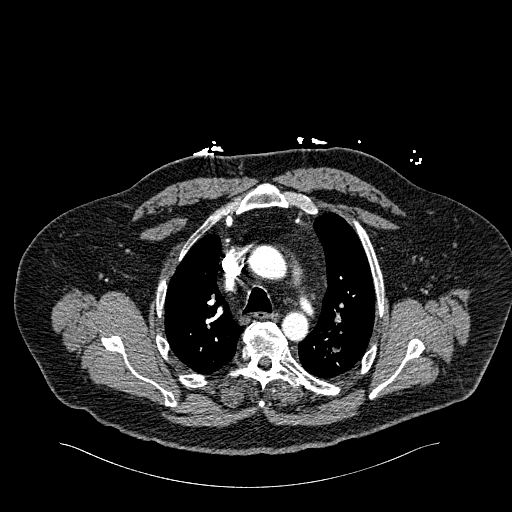
[im 258/332  lung]
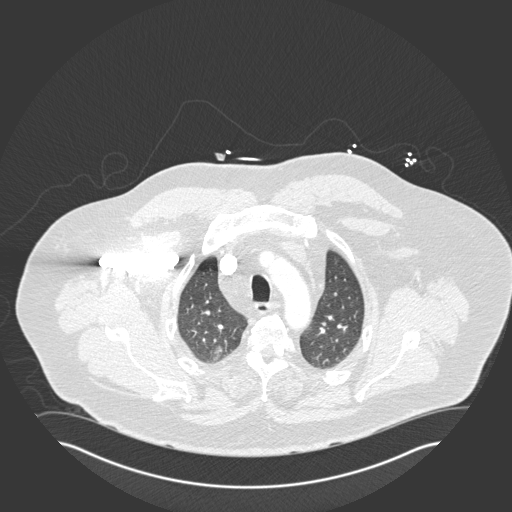
[im 276/332  mediastinal]
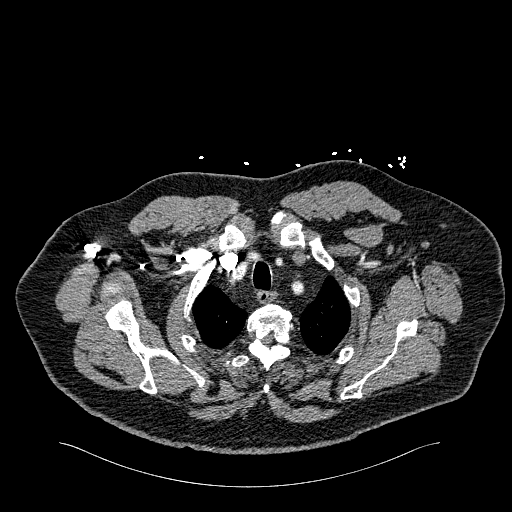
[im 295/332  lung]
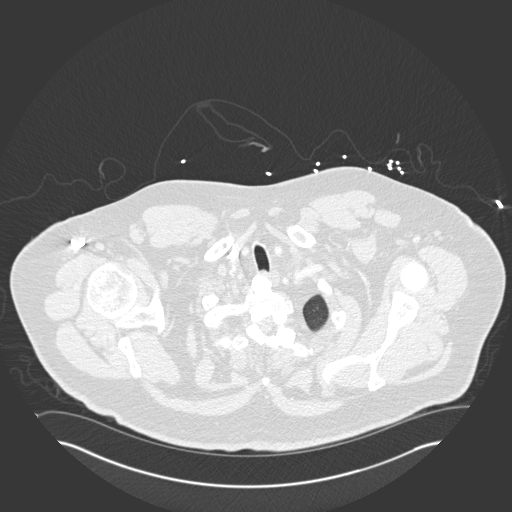
[im 313/332  mediastinal]
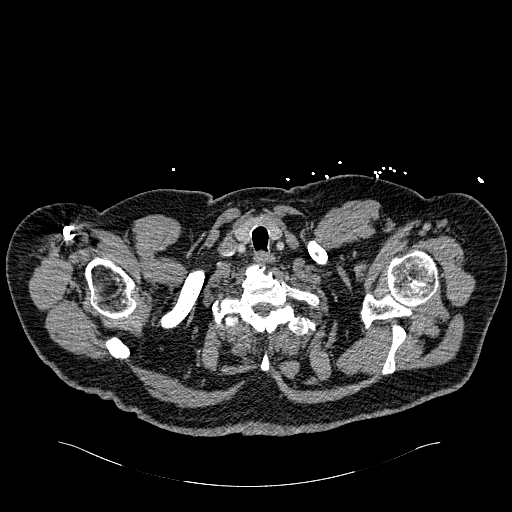

[18 of 30 positions shown; findings below may reference images not displayed]

FINDINGS: Mediastinum / Lymph Nodes: There is no axillary lymphadenopathy. No
mediastinal lymphadenopathy. There is no hilar lymphadenopathy.
Heart size is normal. No pericardial effusion. The esophagus has
normal imaging features.

No thoracic aortic aneurysm. No dissection of the thoracic aorta.
Large volume pulmonary embolus is evident in both main pulmonary
arteries, extending into lobar and segmental pulmonary arteries of
both upper and lower lobes. Thrombus is occlusive in some segmental
branches bilaterally. There is paradoxical inversion of the
interventricular septum and the RV / LV ratio is 1.9.

Lungs / Pleura: Lung windows show no focal airspace consolidation.
There is some atelectasis in the lower lungs bilaterally, left
greater than right. No definite pulmonary infarct at this time. No
pulmonary edema or pleural effusion.

Upper Abdomen: Imaging through the upper abdomen suggests mild
gallbladder wall thickening with potentially a small amount of
pericholecystic fluid. Mild lymphadenopathy is associated in the
hepatoduodenal ligament.

[HOSPITAL] / Soft Tissues: Bone windows reveal no worrisome lytic or
sclerotic osseous lesions.

Review of the MIP images confirms the above findings.
IMPRESSION: 1. Positive for acute PE with CTevidence of right heart strain
(RV/LV Ratio = 1.9) consistent with at least submassive
(intermediate risk) PE. The presence of right heart strain has been
associated with an increased risk of morbidity and mortality.
2. Question mild gallbladder wall thickening with possible
pericholecystic edema. Abdominal ultrasound could be used to further
evaluate as clinically warranted.
3. Mild hepatoduodenal ligament lymphadenopathy.
Critical Value/emergent results were called by me at the time of
interpretation on 05/21/2015 at [DATE] to Dr. OOF LEEN , who
verbally acknowledged these results.

## 2022-08-22 DEATH — deceased
